# Patient Record
Sex: Female | Born: 1990 | Race: Black or African American | Hispanic: No | Marital: Single | Smoking: Former smoker
Health system: Southern US, Community
[De-identification: ages and names within clinical notes are randomized; demographics above are authoritative.]

## PROBLEM LIST (undated history)

## (undated) HISTORY — PX: TONSILLECTOMY: SUR1361

---
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
## 2013-02-18 ENCOUNTER — Ambulatory Visit (INDEPENDENT_AMBULATORY_CARE_PROVIDER_SITE_OTHER): Payer: Self-pay | Admitting: Family Medicine

## 2013-02-18 VITALS — BP 111/71 | HR 71 | Ht 65.5 in | Wt 144.0 lb

## 2013-02-18 DIAGNOSIS — N3090 Cystitis, unspecified without hematuria: Secondary | ICD-10-CM

## 2013-02-18 DIAGNOSIS — R300 Dysuria: Secondary | ICD-10-CM

## 2013-02-18 LAB — POCT URINALYSIS DIPSTICK
Bilirubin, UA: NEGATIVE
Glucose, UA: NEGATIVE
Ketones, UA: NEGATIVE
Nitrite, UA: NEGATIVE
Spec Grav, UA: 1.015
Urobilinogen, UA: NEGATIVE
pH, UA: 8.5

## 2013-02-18 MED ORDER — METRONIDAZOLE 500 MG PO TABS: 500.0000 mg | ORAL_TABLET | Freq: Three times a day (TID) | ORAL | Status: DC

## 2013-02-18 MED ORDER — RANITIDINE HCL 150 MG PO TABS: 150.0000 mg | ORAL_TABLET | Freq: Two times a day (BID) | ORAL | Status: DC

## 2013-02-18 MED ORDER — LEVONORGESTREL-ETHINYL ESTRAD 0.1-20 MG-MCG PO TABS: 1.0000 | ORAL_TABLET | Freq: Every day | ORAL | Status: DC

## 2013-02-18 MED ORDER — PHENAZOPYRIDINE HCL 200 MG PO TABS: 200.0000 mg | ORAL_TABLET | Freq: Three times a day (TID) | ORAL | Status: DC | PRN

## 2013-02-18 MED ORDER — METRONIDAZOLE 0.75 % VA GEL: 1.0000 | Freq: Two times a day (BID) | VAGINAL | Status: DC

## 2013-02-18 MED ORDER — CIPROFLOXACIN HCL 250 MG PO TABS: 250.0000 mg | ORAL_TABLET | Freq: Two times a day (BID) | ORAL | Status: AC

## 2013-02-18 NOTE — Patient Instructions (Addendum)
1)  Bladder Pain/Dysuria:  Take the Pyridium and the Cipro for a bladder infection (3 - 7 days depending on your symptoms).  We are checking you for gonorrhea and chlamydia with the first morning urine sample.  Remember to urinate before and after sex.  Try taking cranberry pills or drink cranberry juice to help decrease your infections.    2)  Vaginal Discharge: If it continues then you can use the Metrogel twice a day for 5-7 days.    3)  Birth Control:  Start on the birth control pills the first Sunday after your next period after you have gotten a negative pregnancy test.    4)  GERD - Try ranitidine 150 -300 mg daily.    Dysuria Dysuria is the medical term for pain with urination. There are many causes for dysuria, but urinary tract infection is the most common. If a urinalysis was performed it can show that there is a urinary tract infection. A urine culture confirms that you or your child is sick. You will need to follow up with a healthcare provider because:  If a urine culture was done you will need to know the culture results and treatment recommendations.  If the urine culture was positive, you or your child will need to be put on antibiotics or know if the antibiotics prescribed are the right antibiotics for your urinary tract infection.  If the urine culture is negative (no urinary tract infection), then other causes may need to be explored or antibiotics need to be stopped. Today laboratory work may have been done and there does not seem to be an infection. If cultures were done they will take at least 24 to 48 hours to be completed. Today x-rays may have been taken and they read as normal. No cause can be found for the problems. The x-rays may be re-read by a radiologist and you will be contacted if additional findings are made. You or your child may have been put on medications to help with this problem until you can see your primary caregiver. If the problems get better, see your  primary caregiver if the problems return. If you were given antibiotics (medications which kill germs), take all of the mediations as directed for the full course of treatment.  If laboratory work was done, you need to find the results. Leave a telephone number where you can be reached. If this is not possible, make sure you find out how you are to get test results. HOME CARE INSTRUCTIONS   Drink lots of fluids. For adults, drink eight, 8 ounce glasses of clear juice or water a day. For children, replace fluids as suggested by your caregiver.  Empty the bladder often. Avoid holding urine for long periods of time.  After a bowel movement, women should cleanse front to back, using each tissue only once.  Empty your bladder before and after sexual intercourse.  Take all the medicine given to you until it is gone. You may feel better in a few days, but TAKE ALL MEDICINE.  Avoid caffeine, tea, alcohol and carbonated beverages, because they tend to irritate the bladder.  In men, alcohol may irritate the prostate.  Only take over-the-counter or prescription medicines for pain, discomfort, or fever as directed by your caregiver.  If your caregiver has given you a follow-up appointment, it is very important to keep that appointment. Not keeping the appointment could result in a chronic or permanent injury, pain, and disability. If there is any  problem keeping the appointment, you must call back to this facility for assistance. SEEK IMMEDIATE MEDICAL CARE IF:   Back pain develops.  A fever develops.  There is nausea (feeling sick to your stomach) or vomiting (throwing up).  Problems are no better with medications or are getting worse. MAKE SURE YOU:   Understand these instructions.  Will watch your condition.  Will get help right away if you are not doing well or get worse. Document Released: 07/08/2004 Document Revised: 01/02/2012 Document Reviewed: 05/15/2008 [facility] Patient  Information 2013 [place], [place].

## 2013-02-18 NOTE — Progress Notes (Signed)
  Subjective:    Patient ID: Martha Sawyer, female    DOB: 1991/09/28, 22 y.o.   MRN: [id number]  Dysuria  This is a recurrent problem. The current episode started 1 to 4 weeks ago. The problem occurs intermittently. The problem has been gradually worsening. The quality of the pain is described as burning and aching. The pain is at a severity of 5/10. The pain is moderate. There has been no fever. She is sexually active. There is no history of pyelonephritis. Associated symptoms include a discharge, frequency, hematuria, hesitancy and urgency. Pertinent negatives include no chills, flank pain or vomiting. She has tried increased fluids for the symptoms. The treatment provided no relief. Her past medical history is significant for recurrent UTIs.    Review of Systems  Constitutional: Negative for fever and chills.  Gastrointestinal: Positive for abdominal pain. Negative for vomiting.  Genitourinary: Positive for dysuria, hesitancy, urgency, frequency and hematuria. Negative for flank pain.       Objective:   Physical Exam  Constitutional: She appears well-nourished. No distress.  Cardiovascular: Normal rate, regular rhythm and normal heart sounds.   Pulmonary/Chest: Effort normal and breath sounds normal.  Abdominal: She exhibits no mass. There is tenderness. There is no rebound, no guarding and no CVA tenderness.  Genitourinary: Vaginal discharge found.  Neurological: She is alert.  Skin: Skin is warm and dry. No rash noted.  Psychiatric: She has a normal mood and affect.          Assessment & Plan:   1)  Cystitis - Sending off urine culture.    2)  BV   3)   Birth Control   4)  GERD  Meds ordered this encounter  Medications  . levonorgestrel-ethinyl estradiol (AVIANE,ALESSE,LESSINA) 0.1-20 MG-MCG tablet    Sig: Take 1 tablet by mouth daily.    Dispense:  3 Package    Refill:  3  . DISCONTD: metroNIDAZOLE (FLAGYL) 500 MG tablet    Sig: Take 1 tablet (500 mg total) by  mouth 3 (three) times daily.    Dispense:  21 tablet    Refill:  0  . phenazopyridine (PYRIDIUM) 200 MG tablet    Sig: Take 1 tablet (200 mg total) by mouth 3 (three) times daily as needed for pain.    Dispense:  21 tablet    Refill:  0  . ciprofloxacin (CIPRO) 250 MG tablet    Sig: Take 1 tablet (250 mg total) by mouth 2 (two) times daily.    Dispense:  14 tablet    Refill:  0  . metroNIDAZOLE (METROGEL VAGINAL) 0.75 % vaginal gel    Sig: Place 1 Applicatorful vaginally 2 (two) times daily.    Dispense:  70 g    Refill:  1  . ranitidine (ZANTAC) 150 MG tablet    Sig: Take 1 tablet (150 mg total) by mouth 2 (two) times daily.    Dispense:  180 tablet    Refill:  3

## 2013-02-20 ENCOUNTER — Encounter: Payer: Self-pay | Admitting: Family Medicine

## 2013-02-20 DIAGNOSIS — R300 Dysuria: Secondary | ICD-10-CM | POA: Insufficient documentation

## 2013-02-20 DIAGNOSIS — N3090 Cystitis, unspecified without hematuria: Secondary | ICD-10-CM | POA: Insufficient documentation

## 2013-02-23 LAB — URINE CULTURE: Colony Count: >=100000

## 2013-09-07 ENCOUNTER — Emergency Department (HOSPITAL_BASED_OUTPATIENT_CLINIC_OR_DEPARTMENT_OTHER)
Admission: EM | Admit: 2013-09-07 | Discharge: 2013-09-07 | Disposition: A | Discharge status: Home / Self Care | Payer: Self-pay | Attending: Emergency Medicine | Admitting: Emergency Medicine

## 2013-09-07 ENCOUNTER — Encounter (HOSPITAL_BASED_OUTPATIENT_CLINIC_OR_DEPARTMENT_OTHER): Payer: Self-pay | Admitting: Emergency Medicine

## 2013-09-07 DIAGNOSIS — G43909 Migraine, unspecified, not intractable, without status migrainosus: Secondary | ICD-10-CM | POA: Insufficient documentation

## 2013-09-07 MED ORDER — DIPHENHYDRAMINE HCL 50 MG/ML IJ SOLN
25.0000 mg | Freq: Once | INTRAMUSCULAR | Status: AC
  Administered 2013-09-07: 25 mg via INTRAVENOUS
  Filled 2013-09-07: qty 1

## 2013-09-07 MED ORDER — SODIUM CHLORIDE 0.9 % IV BOLUS (SEPSIS)
1000.0000 mL | Freq: Once | INTRAVENOUS | Status: AC
  Administered 2013-09-07: 1000 mL via INTRAVENOUS

## 2013-09-07 MED ORDER — METOCLOPRAMIDE HCL 5 MG/ML IJ SOLN
10.0000 mg | Freq: Once | INTRAMUSCULAR | Status: AC
  Administered 2013-09-07: 10 mg via INTRAVENOUS
  Filled 2013-09-07: qty 2

## 2013-09-07 MED ORDER — DEXAMETHASONE SODIUM PHOSPHATE 10 MG/ML IJ SOLN
10.0000 mg | Freq: Once | INTRAMUSCULAR | Status: AC
  Administered 2013-09-07: 10 mg via INTRAVENOUS
  Filled 2013-09-07: qty 1

## 2013-09-07 NOTE — ED Provider Notes (Signed)
CSN: [id number]     Arrival date & time 09/07/13  1358 History   First MD Initiated Contact with Patient 09/07/13 1508     Chief Complaint  Patient presents with  . Headache   (Consider location/radiation/quality/duration/timing/severity/associated sxs/prior Treatment) Patient is a 22 y.o. female presenting with headaches. The history is provided by the patient. No language interpreter was used.  Headache Pain location:  L temporal Radiates to:  Does not radiate Pain severity now: severe. Pain scale at highest: severe. Onset quality:  Gradual Duration:  4 days Timing:  Constant Progression:  Waxing and waning Chronicity:  Recurrent Similar to prior headaches: yes (though longer lasting)   Relieved by:  Nothing Worsened by:  Light Ineffective treatments:  NSAIDs and acetaminophen Associated symptoms: no abdominal pain, no back pain, no blurred vision, no congestion, no cough, no diarrhea, no dizziness, no fatigue, no fever, no focal weakness, no hearing loss, no loss of balance, no myalgias, no nausea, no near-syncope, no neck pain, no neck stiffness, no numbness, no paresthesias, no photophobia, no sore throat, no syncope, no visual change, no vomiting and no weakness     No past medical history on file. Past Surgical History  Procedure Laterality Date  . Tonsillectomy     No family history on file. History  Substance Use Topics  . Smoking status: Never Smoker   . Smokeless tobacco: Not on file  . Alcohol Use: No   OB History   Grav Para Term Preterm Abortions TAB SAB Ect Mult Living                 Review of Systems  Constitutional: Negative for fever, chills, diaphoresis, activity change, appetite change and fatigue.  HENT: Negative for congestion, facial swelling, hearing loss, rhinorrhea and sore throat.   Eyes: Negative for blurred vision, photophobia and discharge.  Respiratory: Negative for cough, chest tightness and shortness of breath.   Cardiovascular:  Negative for chest pain, palpitations, leg swelling, syncope and near-syncope.  Gastrointestinal: Negative for nausea, vomiting, abdominal pain and diarrhea.  Endocrine: Negative for polydipsia and polyuria.  Genitourinary: Negative for dysuria, frequency, difficulty urinating and pelvic pain.  Musculoskeletal: Negative for arthralgias, back pain, myalgias, neck pain and neck stiffness.  Skin: Negative for color change and wound.  Allergic/Immunologic: Negative for immunocompromised state.  Neurological: Positive for headaches. Negative for dizziness, focal weakness, facial asymmetry, weakness, numbness, paresthesias and loss of balance.  Hematological: Does not bruise/bleed easily.  Psychiatric/Behavioral: Negative for confusion and agitation.    Allergies  Review of patient's allergies indicates no known allergies.  Home Medications  No current outpatient prescriptions on file. BP 114/79  Pulse 77  Temp(Src) 98.8 F (37.1 C) (Oral)  Resp 16  SpO2 100%  LMP 08/26/2013 Physical Exam  Constitutional: She is oriented to person, place, and time. She appears well-developed and well-nourished. No distress.  HENT:  Head: Normocephalic and atraumatic.  Mouth/Throat: No oropharyngeal exudate.  Eyes: Pupils are equal, round, and reactive to light.  Neck: Normal range of motion. Neck supple.  Cardiovascular: Normal rate, regular rhythm and normal heart sounds.  Exam reveals no gallop and no friction rub.   No murmur heard. Pulmonary/Chest: Effort normal and breath sounds normal. No respiratory distress. She has no wheezes. She has no rales.  Abdominal: Soft. Bowel sounds are normal. She exhibits no distension and no mass. There is no tenderness. There is no rebound and no guarding.  Musculoskeletal: Normal range of motion. She exhibits no edema and  no tenderness.  Neurological: She is alert and oriented to person, place, and time. She has normal strength. She displays no atrophy and no  tremor. No cranial nerve deficit or sensory deficit. She exhibits normal muscle tone. She displays a negative Romberg sign. Coordination and gait normal. GCS eye subscore is 4. GCS verbal subscore is 5. GCS motor subscore is 6.  Skin: Skin is warm and dry.  Psychiatric: She has a normal mood and affect.    ED Course  Procedures (including critical care time) Labs Review Labs Reviewed - No data to display Imaging Review No results found.  EKG Interpretation   None       MDM   1. Migraine    Pt is a 22 y.o. female with Pmhx as above who presents with h/a for 4 days, mostly L sided waxing/waning, with associated photophobia.  No fever, chill, neck pain/stiffness, numbness, weakness.  Hx of similar symptoms in the past and was treated with unknown migraine rx.  No PE, neuro exam unremarkable.  Doubt CVA, TIA, intracranial bleed, meningitis.  Symptoms resolved after migraine cocktail.  I believe she is safe for d/c.  Return precautions given for new or worsening symptoms including fever, neck pain/stiffness, numbness, weakness.         Shanna Cisco, MD 09/07/13 [phone]

## 2013-09-07 NOTE — ED Notes (Signed)
Pt having headache x 4 days.  No fever, no N/V.  Some pain when looking at a bright light but not sound sensitive.

## 2014-01-30 ENCOUNTER — Emergency Department (HOSPITAL_BASED_OUTPATIENT_CLINIC_OR_DEPARTMENT_OTHER)
Admission: EM | Admit: 2014-01-30 | Discharge: 2014-01-30 | Disposition: A | Discharge status: Home / Self Care | Payer: BC Managed Care – PPO | Attending: Emergency Medicine | Admitting: Emergency Medicine

## 2014-01-30 ENCOUNTER — Encounter (HOSPITAL_BASED_OUTPATIENT_CLINIC_OR_DEPARTMENT_OTHER): Payer: Self-pay | Admitting: Emergency Medicine

## 2014-01-30 DIAGNOSIS — A599 Trichomoniasis, unspecified: Secondary | ICD-10-CM

## 2014-01-30 DIAGNOSIS — Z3202 Encounter for pregnancy test, result negative: Secondary | ICD-10-CM | POA: Insufficient documentation

## 2014-01-30 DIAGNOSIS — A5901 Trichomonal vulvovaginitis: Secondary | ICD-10-CM | POA: Insufficient documentation

## 2014-01-30 LAB — URINALYSIS, ROUTINE W REFLEX MICROSCOPIC
Bilirubin Urine: NEGATIVE
Glucose, UA: NEGATIVE mg/dL
Ketones, ur: NEGATIVE mg/dL
Nitrite: NEGATIVE
PH: 6 (ref 5.0–8.0)
Protein, ur: NEGATIVE mg/dL
Specific Gravity, Urine: 1.022 (ref 1.005–1.030)
Urobilinogen, UA: 1 mg/dL (ref 0.0–1.0)

## 2014-01-30 LAB — URINE MICROSCOPIC-ADD ON

## 2014-01-30 LAB — PREGNANCY, URINE: PREG TEST UR: NEGATIVE

## 2014-01-30 LAB — WET PREP, GENITAL: Yeast Wet Prep HPF POC: NONE SEEN

## 2014-01-30 MED ORDER — METRONIDAZOLE 500 MG PO TABS: 500.0000 mg | ORAL_TABLET | Freq: Two times a day (BID) | ORAL | Status: DC

## 2014-01-30 NOTE — ED Provider Notes (Signed)
Medical screening examination/treatment/procedure(s) were performed by non-physician practitioner and as supervising physician I was immediately available for consultation/collaboration.   EKG Interpretation None        William Kristi Hyer, MD 01/30/14 2303 

## 2014-01-30 NOTE — ED Notes (Signed)
Brown vaginal discharge for a few weeks. Lower abdominal pain.

## 2014-01-30 NOTE — ED Provider Notes (Signed)
CSN: [id number]     Arrival date & time 01/30/14  1343 History   First MD Initiated Contact with Patient 01/30/14 1528     Chief Complaint  Patient presents with  . Abdominal Pain     (Consider location/radiation/quality/duration/timing/severity/associated sxs/prior Treatment) Patient is a 23 y.o. female presenting with abdominal pain. The history is provided by the patient. No language interpreter was used.  Abdominal Pain Pain location:  RLQ and LLQ Pain quality: aching   Pain radiates to:  Does not radiate Pain severity:  Mild Onset quality:  Gradual Progression:  Worsening Chronicity:  New Relieved by:  Nothing Worsened by:  Nothing tried Ineffective treatments:  None tried Associated symptoms: vaginal discharge     History reviewed. No pertinent past medical history. Past Surgical History  Procedure Laterality Date  . Tonsillectomy     No family history on file. History  Substance Use Topics  . Smoking status: Never Smoker   . Smokeless tobacco: Not on file  . Alcohol Use: No   OB History   Grav Para Term Preterm Abortions TAB SAB Ect Mult Living                 Review of Systems  Gastrointestinal: Positive for abdominal pain.  Genitourinary: Positive for vaginal discharge and vaginal pain.  All other systems reviewed and are negative.     Allergies  Review of patient's allergies indicates no known allergies.  Home Medications  No current outpatient prescriptions on file. BP 103/68  Pulse 86  Temp(Src) 98.4 F (36.9 C) (Oral)  Resp 20  Ht 5\' 6"  (1.676 m)  Wt 144 lb (65.318 kg)  BMI 23.25 kg/m2  SpO2 100%  LMP 01/16/2014 Physical Exam  Nursing note and vitals reviewed. Constitutional: She is oriented to person, place, and time. She appears well-developed and well-nourished.  HENT:  Head: Normocephalic and atraumatic.  Eyes: Conjunctivae and EOM are normal. Pupils are equal, round, and reactive to light.  Neck: Normal range of motion.   Cardiovascular: Normal rate.   Pulmonary/Chest: Effort normal.  Abdominal: Soft. She exhibits no distension.  Genitourinary: Vaginal discharge found.  scant white discharge  Musculoskeletal: Normal range of motion.  Neurological: She is alert and oriented to person, place, and time.  Skin: Skin is warm.  Psychiatric: She has a normal mood and affect.    ED Course  Procedures (including critical care time) Labs Review Labs Reviewed  URINALYSIS, ROUTINE W REFLEX MICROSCOPIC - Abnormal; Notable for the following:    APPearance CLOUDY (*)    Hgb urine dipstick MODERATE (*)    Leukocytes, UA MODERATE (*)    All other components within normal limits  URINE MICROSCOPIC-ADD ON - Abnormal; Notable for the following:    Squamous Epithelial / LPF FEW (*)    Bacteria, UA MANY (*)    All other components within normal limits  WET PREP, GENITAL  GC/CHLAMYDIA PROBE AMP  PREGNANCY, URINE   Imaging Review No results found.   EKG Interpretation None      MDM   Final diagnoses:  Trichimoniasis   Flagyl RX.     Lonia SkinnerLeslie K WordenSofia, PA-C 01/30/14 1606

## 2014-01-30 NOTE — Discharge Instructions (Signed)
Trichomoniasis °Trichomoniasis is an infection, caused by the Trichomonas organism, that affects both women and men. In women, the outer female genitalia and the vagina are affected. In men, the penis is mainly affected, but the prostate and other reproductive organs can also be involved. Trichomoniasis is a sexually transmitted disease (STD) and is most often passed to another person through sexual contact. The majority of people who get trichomoniasis do so from a sexual encounter and are also at risk for other STDs. °CAUSES  °· Sexual intercourse with an infected partner. °· It can be present in swimming pools or hot tubs. °SYMPTOMS  °· Abnormal gray-green frothy vaginal discharge in women. °· Vaginal itching and irritation in women. °· Itching and irritation of the area outside the vagina in women. °· Penile discharge with or without pain in males. °· Inflammation of the urethra (urethritis), causing painful urination. °· Bleeding after sexual intercourse. °RELATED COMPLICATIONS °· Pelvic inflammatory disease. °· Infection of the uterus (endometritis). °· Infertility. °· Tubal (ectopic) pregnancy. °· It can be associated with other STDs, including gonorrhea and chlamydia, hepatitis B, and HIV. °COMPLICATIONS DURING PREGNANCY °· Early (premature) delivery. °· Premature rupture of the membranes (PROM). °· Low birth weight. °DIAGNOSIS  °· Visualization of Trichomonas under the microscope from the vagina discharge. °· Ph of the vagina greater than 4.5, tested with a test tape. °· Trich Rapid Test. °· Culture of the organism, but this is not usually needed. °· It may be found on a Pap test. °· Having a "strawberry cervix,"which means the cervix looks very red like a strawberry. °TREATMENT  °· You may be given medication to fight the infection. Inform your caregiver if you could be or are pregnant. Some medications used to treat the infection should not be taken during pregnancy. °· Over-the-counter medications or  creams to decrease itching or irritation may be recommended. °· Your sexual partner will need to be treated if infected. °HOME CARE INSTRUCTIONS  °· Take all medication prescribed by your caregiver. °· Take over-the-counter medication for itching or irritation as directed by your caregiver. °· Do not have sexual intercourse while you have the infection. °· Do not douche or wear tampons. °· Discuss your infection with your partner, as your partner may have acquired the infection from you. Or, your partner may have been the person who transmitted the infection to you. °· Have your sex partner examined and treated if necessary. °· Practice safe, informed, and protected sex. °· See your caregiver for other STD testing. °SEEK MEDICAL CARE IF:  °· You still have symptoms after you finish the medication. °· You have an oral temperature above 102° F (38.9° C). °· You develop belly (abdominal) pain. °· You have pain when you urinate. °· You have bleeding after sexual intercourse. °· You develop a rash. °· The medication makes you sick or makes you throw up (vomit). °Document Released: 04/05/2001 Document Revised: 01/02/2012 Document Reviewed: 05/01/2009 °[employer]® Patient Information ©2014 [employer]. ° °

## 2014-01-30 NOTE — ED Notes (Signed)
Pelvic cart at bedside. 

## 2014-01-31 LAB — GC/CHLAMYDIA PROBE AMP
CT PROBE, AMP APTIMA: NEGATIVE
GC PROBE AMP APTIMA: NEGATIVE

## 2014-02-11 ENCOUNTER — Ambulatory Visit: Payer: Self-pay | Admitting: Family Medicine

## 2014-04-01 ENCOUNTER — Encounter: Payer: Self-pay | Admitting: Family Medicine

## 2014-04-01 ENCOUNTER — Ambulatory Visit (INDEPENDENT_AMBULATORY_CARE_PROVIDER_SITE_OTHER): Payer: BC Managed Care – PPO | Admitting: Family Medicine

## 2014-04-01 ENCOUNTER — Other Ambulatory Visit (HOSPITAL_COMMUNITY)
Admission: RE | Admit: 2014-04-01 | Discharge: 2014-04-01 | Disposition: A | Discharge status: Home / Self Care | Payer: BC Managed Care – PPO | Source: Ambulatory Visit | Attending: Family Medicine | Admitting: Family Medicine

## 2014-04-01 VITALS — BP 117/76 | HR 86 | Resp 16 | Ht 65.5 in | Wt 145.0 lb

## 2014-04-01 DIAGNOSIS — Z3202 Encounter for pregnancy test, result negative: Secondary | ICD-10-CM

## 2014-04-01 DIAGNOSIS — Z01419 Encounter for gynecological examination (general) (routine) without abnormal findings: Secondary | ICD-10-CM | POA: Insufficient documentation

## 2014-04-01 DIAGNOSIS — B373 Candidiasis of vulva and vagina: Secondary | ICD-10-CM

## 2014-04-01 DIAGNOSIS — Z124 Encounter for screening for malignant neoplasm of cervix: Secondary | ICD-10-CM

## 2014-04-01 DIAGNOSIS — H6120 Impacted cerumen, unspecified ear: Secondary | ICD-10-CM

## 2014-04-01 DIAGNOSIS — B3731 Acute candidiasis of vulva and vagina: Secondary | ICD-10-CM

## 2014-04-01 DIAGNOSIS — B9689 Other specified bacterial agents as the cause of diseases classified elsewhere: Secondary | ICD-10-CM

## 2014-04-01 DIAGNOSIS — N760 Acute vaginitis: Secondary | ICD-10-CM | POA: Insufficient documentation

## 2014-04-01 DIAGNOSIS — A499 Bacterial infection, unspecified: Secondary | ICD-10-CM

## 2014-04-01 DIAGNOSIS — Z113 Encounter for screening for infections with a predominantly sexual mode of transmission: Secondary | ICD-10-CM | POA: Insufficient documentation

## 2014-04-01 LAB — POCT URINALYSIS DIPSTICK
Bilirubin, UA: NEGATIVE
Glucose, UA: NEGATIVE
Ketones, UA: NEGATIVE
Leukocytes, UA: NEGATIVE
Nitrite, UA: NEGATIVE
Protein, UA: NEGATIVE
Spec Grav, UA: 1.015
Urobilinogen, UA: NEGATIVE
pH, UA: 6.5

## 2014-04-01 LAB — POCT URINE PREGNANCY: Preg Test, Ur: NEGATIVE

## 2014-04-01 MED ORDER — METRONIDAZOLE 500 MG PO TABS: 500.0000 mg | ORAL_TABLET | Freq: Two times a day (BID) | ORAL | Status: AC

## 2014-04-01 MED ORDER — TERCONAZOLE 0.8 % VA CREA: 1.0000 | TOPICAL_CREAM | Freq: Every day | VAGINAL | Status: AC

## 2014-04-01 NOTE — Patient Instructions (Addendum)
1)  Birth Control - Try the Nuvaring to see if you like it.  You will insert it using a cardboard tampon applicator into the vagina the Sunday after you start your next period.  You will wear it for 3 weeks tthen take out and don't wear one for 1 week then put it in the following Sunday.  If you want to get the 3 year birth control for the arm contact [facility] at [facility].     Etonogestrel implant What is this medicine? ETONOGESTREL (et oh noe JES trel) is a contraceptive (birth control) device. It is used to prevent pregnancy. It can be used for up to 3 years. This medicine may be used for other purposes; ask your health care provider or pharmacist if you have questions. COMMON BRAND NAME(S): Implanon, Nexplanon  What should I tell my health care provider before I take this medicine? They need to know if you have any of these conditions: -abnormal vaginal bleeding -blood vessel disease or blood clots -cancer of the breast, cervix, or liver -depression -diabetes -gallbladder disease -headaches -heart disease or recent heart attack -high blood pressure -high cholesterol -kidney disease -liver disease -renal disease -seizures -tobacco smoker -an unusual or allergic reaction to etonogestrel, other hormones, anesthetics or antiseptics, medicines, foods, dyes, or preservatives -pregnant or trying to get pregnant -breast-feeding How should I use this medicine? This device is inserted just under the skin on the inner side of your upper arm by a health care professional. Talk to your pediatrician regarding the use of this medicine in children. Special care may be needed. Overdosage: If you think you've taken too much of this medicine contact a poison control center or emergency room at once. Overdosage: If you think you have taken too much of this medicine contact a poison control center or emergency room at once. NOTE: This medicine is only for you. Do not share this medicine with  others. What if I miss a dose? This does not apply. What may interact with this medicine? Do not take this medicine with any of the following medications: -amprenavir -bosentan -fosamprenavir This medicine may also interact with the following medications: -barbiturate medicines for inducing sleep or treating seizures -certain medicines for fungal infections like ketoconazole and itraconazole -griseofulvin -medicines to treat seizures like carbamazepine, felbamate, oxcarbazepine, phenytoin, topiramate -modafinil -phenylbutazone -rifampin -some medicines to treat HIV infection like atazanavir, indinavir, lopinavir, nelfinavir, tipranavir, ritonavir -St. John's wort This list may not describe all possible interactions. Give your health care provider a list of all the medicines, herbs, non-prescription drugs, or dietary supplements you use. Also tell them if you smoke, drink alcohol, or use illegal drugs. Some items may interact with your medicine. What should I watch for while using this medicine? This product does not protect you against HIV infection (AIDS) or other sexually transmitted diseases. You should be able to feel the implant by pressing your fingertips over the skin where it was inserted. Tell your doctor if you cannot feel the implant. What side effects may I notice from receiving this medicine? Side effects that you should report to your doctor or health care professional as soon as possible: -allergic reactions like skin rash, itching or hives, swelling of the face, lips, or tongue -breast lumps -changes in vision -confusion, trouble speaking or understanding -dark urine -depressed mood -general ill feeling or flu-like symptoms -light-colored stools -loss of appetite, nausea -right upper belly pain -severe headaches -severe pain, swelling, or tenderness in the abdomen -shortness  of breath, chest pain, swelling in a leg -signs of pregnancy -sudden numbness or weakness  of the face, arm or leg -trouble walking, dizziness, loss of balance or coordination -unusual vaginal bleeding, discharge -unusually weak or tired -yellowing of the eyes or skin Side effects that usually do not require medical attention (Report these to your doctor or health care professional if they continue or are bothersome.): -acne -breast pain -changes in weight -cough -fever or chills -headache -irregular menstrual bleeding -itching, burning, and vaginal discharge -pain or difficulty passing urine -sore throat This list may not describe all possible side effects. Call your doctor for medical advice about side effects. You may report side effects to FDA at 1-800-FDA-1088. Where should I keep my medicine? This drug is given in a hospital or clinic and will not be stored at home. NOTE: This sheet is a summary. It may not cover all possible information. If you have questions about this medicine, talk to your doctor, pharmacist, or health care provider.  2014, [employer]vier/[employer] Standard. (2012-04-16 15:37:45)

## 2014-04-01 NOTE — Progress Notes (Signed)
Subjective:    Patient ID: Martha Sawyer, female    DOB: 02/19/1991, 23 y.o.   MRN: [id number]  HPI  Martha Sawyer is here today for a GYN exam.  Overall, she feels that she is doing well except for a couple of issues:    1)  Left Ear:  Her left ear feels stopped up. She would like to have it flushed out.  2)  Vaginal Discomfort:  She says that she is having some vaginal discomfort. She denies vaginal discharge, odor or dysuria. She feels that she may have BV. She does have a little itching.    Review of Systems  Constitutional: Negative for activity change, appetite change, fatigue and unexpected weight change.  HENT: Negative for congestion, dental problem, ear pain, hearing loss, trouble swallowing and voice change.        Left ear feels stopped up  Eyes: Negative for pain, redness and visual disturbance.  Respiratory: Negative for cough and shortness of breath.   Cardiovascular: Negative for chest pain, palpitations and leg swelling.  Gastrointestinal: Negative for nausea, vomiting, abdominal pain, diarrhea, constipation and blood in stool.  Endocrine: Negative for cold intolerance, heat intolerance, polydipsia, polyphagia and polyuria.  Genitourinary: Positive for vaginal pain. Negative for dysuria, urgency, frequency, hematuria, vaginal discharge, menstrual problem and pelvic pain.  Musculoskeletal: Negative for arthralgias, back pain, joint swelling, myalgias and neck pain.  Skin: Negative for rash.  Neurological: Negative for dizziness, weakness and headaches.  Hematological: Negative for adenopathy. Does not bruise/bleed easily.  Psychiatric/Behavioral: Negative for sleep disturbance, dysphoric mood and decreased concentration. The patient is not nervous/anxious.   All other systems reviewed and are negative.    Past Surgical History  Procedure Laterality Date  . 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 Lupus Mother   . Diabetes Maternal Aunt   . Diabetes Maternal Uncle   . Hypertension Maternal Grandmother   . Depression Maternal Grandmother     No Known Allergies      Objective:   Physical Exam  Nursing note and vitals reviewed. Constitutional: She is oriented to person, place, and time. She appears well-developed and well-nourished.  HENT:  Head: Normocephalic and atraumatic.  Right Ear: External ear normal.  Left Ear: There is drainage.  Nose: Nose normal.  Mouth/Throat: Oropharynx is clear and moist.  Eyes: Conjunctivae are normal. Pupils are equal, round, and reactive to light. No scleral icterus.  Neck: Normal range of motion. Neck supple. No thyromegaly present.  Cardiovascular: Normal rate, regular rhythm, normal heart sounds and intact distal pulses.  Exam reveals no gallop and no friction rub.   No murmur heard. Pulmonary/Chest: Effort normal and breath sounds normal.  Abdominal: Soft. Bowel sounds are normal.  Genitourinary: Uterus normal. Vaginal discharge found.  Musculoskeletal: Normal range of motion. She exhibits no edema and no tenderness.  Lymphadenopathy:    She has no cervical adenopathy.  Neurological: She is alert and oriented to person, place, and time. She has normal reflexes.  Skin: Skin is warm and dry.  Psychiatric: She has a normal mood and affect. Her behavior is normal. Judgment and thought content normal.      Assessment &  Plan:    Martha Sawyer was seen today for gynecologic exam.  Diagnoses and associated orders for this visit:  Routine gynecological examination - POCT urinalysis dipstick  Screening for malignant neoplasm of the cervix - Cytology - PAP  BV (bacterial vaginosis) - metroNIDAZOLE (FLAGYL) 500 MG tablet; Take 1 tablet (500 mg total) by mouth 2  (two) times daily with a meal. DO NOT CONSUME ALCOHOL WHILE TAKING THIS MEDICATION.  Yeast vaginitis - terconazole (TERAZOL 3) 0.8 % vaginal cream; Place 1 applicator vaginally at bedtime.  Negative pregnancy test - POCT urine pregnancy

## 2014-04-03 ENCOUNTER — Other Ambulatory Visit (HOSPITAL_COMMUNITY)
Admission: RE | Admit: 2014-04-03 | Discharge: 2014-04-03 | Disposition: A | Discharge status: Home / Self Care | Payer: BC Managed Care – PPO | Source: Ambulatory Visit | Attending: Family Medicine | Admitting: Family Medicine

## 2014-04-03 LAB — CYTOLOGY - PAP

## 2014-06-09 DIAGNOSIS — B373 Candidiasis of vulva and vagina: Secondary | ICD-10-CM | POA: Insufficient documentation

## 2014-06-09 DIAGNOSIS — N760 Acute vaginitis: Secondary | ICD-10-CM | POA: Insufficient documentation

## 2014-06-09 DIAGNOSIS — B9689 Other specified bacterial agents as the cause of diseases classified elsewhere: Secondary | ICD-10-CM | POA: Insufficient documentation

## 2014-06-09 DIAGNOSIS — B3731 Acute candidiasis of vulva and vagina: Secondary | ICD-10-CM | POA: Insufficient documentation

## 2016-08-23 ENCOUNTER — Encounter (HOSPITAL_BASED_OUTPATIENT_CLINIC_OR_DEPARTMENT_OTHER): Payer: Self-pay | Admitting: Emergency Medicine

## 2016-08-23 ENCOUNTER — Emergency Department (HOSPITAL_BASED_OUTPATIENT_CLINIC_OR_DEPARTMENT_OTHER)
Admission: EM | Admit: 2016-08-23 | Discharge: 2016-08-23 | Disposition: A | Discharge status: Home / Self Care | Payer: Self-pay | Attending: Physician Assistant | Admitting: Physician Assistant

## 2016-08-23 DIAGNOSIS — N760 Acute vaginitis: Secondary | ICD-10-CM | POA: Insufficient documentation

## 2016-08-23 DIAGNOSIS — B9689 Other specified bacterial agents as the cause of diseases classified elsewhere: Secondary | ICD-10-CM

## 2016-08-23 LAB — WET PREP, GENITAL
SPERM: NONE SEEN
Trich, Wet Prep: NONE SEEN
Yeast Wet Prep HPF POC: NONE SEEN

## 2016-08-23 LAB — URINE MICROSCOPIC-ADD ON

## 2016-08-23 LAB — URINALYSIS, ROUTINE W REFLEX MICROSCOPIC
BILIRUBIN URINE: NEGATIVE
Glucose, UA: NEGATIVE mg/dL
KETONES UR: NEGATIVE mg/dL
NITRITE: NEGATIVE
PROTEIN: NEGATIVE mg/dL
Specific Gravity, Urine: 1.013 (ref 1.005–1.030)
pH: 7 (ref 5.0–8.0)

## 2016-08-23 LAB — PREGNANCY, URINE: PREG TEST UR: NEGATIVE

## 2016-08-23 MED ORDER — METRONIDAZOLE 500 MG PO TABS: 500.0000 mg | ORAL_TABLET | Freq: Two times a day (BID) | ORAL | 0 refills | Status: AC

## 2016-08-23 MED ORDER — METRONIDAZOLE 500 MG PO TABS
500.0000 mg | ORAL_TABLET | Freq: Once | ORAL | Status: AC
  Administered 2016-08-23: 500 mg via ORAL
  Filled 2016-08-23: qty 1

## 2016-08-23 NOTE — ED Provider Notes (Signed)
[facility] Note   CSN: [id number] Arrival date & time: 08/23/16  [id number]  By signing my name below, I, Martha Sawyer, attest that this documentation has been prepared under the direction and in the presence of Sandar Krinke Randall AnLyn Jolyn Deshmukh, MD. Electronically Signed: Soijett Sawyer, ED Scribe. 08/23/16. 8:30 PM.   History   Chief Complaint Chief Complaint  Patient presents with  . Pelvic Pain    HPI Martha Sawyer is a 25 y.o. female who presents to the Emergency Department complaining of lower abdominal pain onset 2 weeks. Patient notes that her ast menstrual period was 07/24/2016. Pt denies any new sexual partners at this time. Pt is having associated symptoms of white vaginal discharge. She notes that she has not tried any medications for the relief of her symptoms. She denies diarrhea, constipation, and any other symptoms. Pt PCP is Dr. Birdena Jubileeobyn Zanard with no recent appointments until 08/29/16.  Pt secondarily complains of a test result of HCV that resulted positive. Pt notes that the clinic was going to re-run the labs to confirm the HCV result. Pt denies IV drug use or having a blood transfusion. Pt hasn't tried any medications for the relief of her symptoms. Pt denies any other symptoms. 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   The history is provided by the patient. No language interpreter was used.    History reviewed. No pertinent past medical history.  Patient Active Problem List   Diagnosis Date Noted  . BV (bacterial vaginosis) 06/09/2014  . Yeast vaginitis 06/09/2014  . Dysuria 02/20/2013  . Cystitis 02/20/2013    Past Surgical History:  Procedure Laterality Date  . TONSILLECTOMY      OB History    No data available       Home Medications    Prior to Admission medications   Not on File    Family History Family History  Problem Relation Age of Onset  . Lupus Mother   . Hypertension Maternal Grandmother   . Depression  Maternal Grandmother   . Diabetes Maternal Aunt   . Diabetes Maternal Uncle     Social History Social History  Substance Use Topics  . Smoking status: Never Smoker  . Smokeless tobacco: Never Used  . Alcohol use No     Allergies   Review of patient's allergies indicates no known allergies.   Review of Systems Review of Systems  All other systems reviewed and are negative.    Physical Exam Updated Vital Signs BP 125/72 (BP Location: Right Arm)   Pulse 83   Temp 98.4 F (36.9 C) (Oral)   Resp 18   Ht 5\' 6"  (1.676 m)   Wt 140 lb (63.5 kg)   LMP 07/24/2016   SpO2 100%   BMI 22.60 kg/m   Physical Exam  Constitutional: She is oriented to person, place, and time. She appears well-developed and well-nourished. No distress.  HENT:  Head: Normocephalic and atraumatic.  Eyes: EOM are normal.  Neck: Neck supple.  Cardiovascular: Normal rate.   Pulmonary/Chest: Effort normal. No respiratory distress.  Abdominal: Soft. She exhibits no distension. There is no tenderness.  Genitourinary: Cervix exhibits no motion tenderness. Vaginal discharge found.  Genitourinary Comments: Chaperone present for exam. Thin white discharge. No CMT. No pain on palpation.  Musculoskeletal: Normal range of motion.  Neurological: She is alert and oriented to person, place, and time.  Skin: Skin is warm and dry.  Psychiatric: She has a normal mood  and affect. Her behavior is normal.  Nursing note and vitals reviewed.    ED Treatments / Results  DIAGNOSTIC STUDIES: Oxygen Saturation is 100% on RA, nl by my interpretation.    COORDINATION OF CARE: 8:06 PM Discussed treatment plan with pt at bedside which includes pelvic exam, wet prep, UA, and pt agreed to plan.   Labs (all labs ordered are listed, but only abnormal results are displayed) Labs Reviewed  WET PREP, GENITAL - Abnormal; Notable for the following:       Result Value   Clue Cells Wet Prep HPF POC PRESENT (*)    WBC, Wet Prep  HPF POC FEW (*)    All other components within normal limits  URINALYSIS, ROUTINE W REFLEX MICROSCOPIC (NOT AT [facility]) - Abnormal; Notable for the following:    Hgb urine dipstick SMALL (*)    Leukocytes, UA SMALL (*)    All other components within normal limits  URINE MICROSCOPIC-ADD ON - Abnormal; Notable for the following:    Squamous Epithelial / LPF 0-5 (*)    Bacteria, UA FEW (*)    All other components within normal limits  PREGNANCY, URINE  GC/CHLAMYDIA PROBE AMP ([place]) NOT AT [facility]    Procedures Procedures (including critical care time)  Medications Ordered in ED Medications  metroNIDAZOLE (FLAGYL) tablet 500 mg (not administered)     Initial Impression / Assessment and Plan / ED Course  I have reviewed the triage vital signs and the nursing notes.  Pertinent labs that were available during my care of the patient were reviewed by me and considered in my medical decision making (see chart for details).  Clinical Course   Patient is a 25 year old female presenting for 2 things. 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 They are going to repeat it. No IVDU, no ho transfusion. She was curious whether we could repeat it here. I told her that we do not do that very readily but she should follow-up with her primary care patient in the in the morning for this. She agrees that this will be a good process. Secondly she has occasional suprapubic/pelvic pain. This is one of the last 2 weeks. She has mild discharge. Vaginal exam shows thin discharge. Positive clue cells. We'll treat for BV. Do not suspect any kind of ovarian torsion or other pathology given her low-grade pain and time period since it started.  Final Clinical Impressions(s) / ED Diagnoses   Final diagnoses:  None    New Prescriptions New Prescriptions   No medications on file   I personally performed the services described in this documentation, which was  scribed in my presence. The recorded information has been reviewed and is accurate.      Mazzie Brodrick Randall AnLyn Denyce Harr, MD 08/23/16 2126

## 2016-08-23 NOTE — ED Triage Notes (Signed)
Patient reports that she has had pain to her pelvic region and discharge for about 2 weeks. Denies any N/V/D

## 2016-08-23 NOTE — ED Notes (Signed)
ED Provider at bedside. 

## 2016-08-23 NOTE — Discharge Instructions (Signed)
We are treating you for BV. Please return with any concerns.

## 2016-08-24 LAB — GC/CHLAMYDIA PROBE AMP (~~LOC~~) NOT AT ARMC
Chlamydia: NEGATIVE
Neisseria Gonorrhea: NEGATIVE

## 2017-02-13 ENCOUNTER — Encounter (HOSPITAL_BASED_OUTPATIENT_CLINIC_OR_DEPARTMENT_OTHER): Payer: Self-pay | Admitting: *Deleted

## 2017-02-13 ENCOUNTER — Emergency Department (HOSPITAL_BASED_OUTPATIENT_CLINIC_OR_DEPARTMENT_OTHER)
Admission: EM | Admit: 2017-02-13 | Discharge: 2017-02-13 | Disposition: A | Discharge status: Home / Self Care | Payer: Self-pay | Attending: Emergency Medicine | Admitting: Emergency Medicine

## 2017-02-13 DIAGNOSIS — R21 Rash and other nonspecific skin eruption: Secondary | ICD-10-CM | POA: Insufficient documentation

## 2017-02-13 MED ORDER — PREDNISONE 20 MG PO TABS: ORAL_TABLET | ORAL | 0 refills | Status: AC

## 2017-02-13 MED FILL — predniSONE 20 MG TABS: 20 | 7 days supply | Qty: 14 | Fill #0

## 2017-02-13 NOTE — ED Provider Notes (Signed)
[facility]   CSN: [id number] Arrival date & time: 02/13/17  1156     History   Chief Complaint Chief Complaint  Patient presents with  . Rash    HPI Martha Sawyer is a 26 y.o. female.Patient developed a rash on her thighs bilaterally and on her arms bilaterally and slight swelling for face which started yesterday, rashes nonpainful and mildly pruritic. She does not feel ill. Denies fever No new medications or foods no difficulty breathing no difficulty swallowing no voice change. No other associated symptoms treated self with Benadryl without relief. Nothing makes symptoms better or worse however she feels rashes slightly worse today than yesterday  HPI  History reviewed. No pertinent past medical history.  Patient Active Problem List   Diagnosis Date Noted  . BV (bacterial vaginosis) 06/09/2014  . Yeast vaginitis 06/09/2014  . Dysuria 02/20/2013  . Cystitis 02/20/2013    Past Surgical History:  Procedure Laterality Date  . TONSILLECTOMY      OB History    No data available       Home Medications    Prior to Admission medications   Medication Sig Start Date End Date Taking? Authorizing Provider  metroNIDAZOLE (FLAGYL) 500 MG tablet Take 1 tablet (500 mg total) by mouth 2 (two) times daily. 08/23/16   Courteney Lyn Mackuen, MD  predniSONE (DELTASONE) 20 MG tablet 2 tabs po daily x 7 days 02/13/17   Doug Sou, MD    Family History Family History  Problem Relation Age of Onset  . Lupus Mother   . Hypertension Maternal Grandmother   . Depression Maternal Grandmother   . Diabetes Maternal Aunt   . Diabetes Maternal Uncle     Social History Social History  Substance Use Topics  . Smoking status: Never Smoker  . Smokeless tobacco: Never Used  . Alcohol use No     Allergies   Patient has no known allergies.   Review of Systems Review of Systems  Constitutional: Negative.   HENT: Negative.   Respiratory: Negative.     Cardiovascular: Negative.   Gastrointestinal: Negative.   Musculoskeletal: Negative.   Skin: Positive for rash.  Neurological: Negative.   Psychiatric/Behavioral: Negative.   All other systems reviewed and are negative.    Physical Exam Updated Vital Signs BP 114/75 (BP Location: Left Arm)   Pulse 82   Temp 98.9 F (37.2 C) (Oral)   Resp 16   Ht  (1.676 m)   Wt 139 lb (63 kg)   LMP 02/06/2017   SpO2 100%   BMI 22.44 kg/m   Physical Exam  Constitutional: She is oriented to person, place, and time. She appears well-developed and well-nourished. No distress.  HENT:  Head: Normocephalic and atraumatic.  No mucosal lesion  Eyes: Conjunctivae are normal. Pupils are equal, round, and reactive to light.  Neck: Neck supple. No tracheal deviation present. No thyromegaly present.  Cardiovascular: Normal rate and regular rhythm.   No murmur heard. Pulmonary/Chest: Effort normal and breath sounds normal.  Abdominal: Soft. Bowel sounds are normal. She exhibits no distension. There is no tenderness.  Musculoskeletal: Normal range of motion. She exhibits no edema or tenderness.  Neurological: She is alert and oriented to person, place, and time. Coordination normal.  Skin: Skin is warm and dry. Rash noted.  Pinkish confluent rash on bilateral thighs, medial aspect most prominent. Also on bilateral upper arms. Rashes not involve palms or soles. Is nonpalpable.  Psychiatric: She has a normal  mood and affect.  Nursing note and vitals reviewed.    ED Treatments / Results  Labs (all labs ordered are listed, but only abnormal results are displayed) Labs Reviewed - No data to display  EKG  EKG Interpretation None       Radiology No results found.  Procedures Procedures (including critical care time)  Medications Ordered in ED Medications - No data to display   Initial Impression / Assessment and Plan / ED Course  I have reviewed the triage vital signs and the  nursing notes.  Pertinent labs & imaging results that were available during my care of the patient were reviewed by me and considered in my medical decision making (see chart for details).     Rest at this point is felt to be nonspecific. She is to take Benadryl as needed for itch Plan prescription prednisone, continue to take Benadryl as needed for itch. Referral Dr.Lupton, dermatologist as needed Final Clinical Impressions(s) / ED Diagnoses  Diagnosis nonspecific rash Final diagnoses:  Rash    New Prescriptions Discharge Medication List as of 02/13/2017 12:43 PM    START taking these medications   Details  predniSONE (DELTASONE) 20 MG tablet 2 tabs po daily x 7 days, Print         Doug Sou, MD 02/13/17 1300

## 2017-02-13 NOTE — ED Triage Notes (Signed)
Woke with rash yesterday. She has been taking Benadryl. Last dose 11pm. None today.

## 2017-02-13 NOTE — Discharge Instructions (Signed)
Take the medication as prescribed. You can continue to take Benadryl 50 mg every 6 hours if needed for itch. Benadryl can cause drowsiness so don't drive or operate machinery while taking Benadryl. Call Dr. Terri Piedra or call Dr. Alberteen Reece Fehnel for referral to a dermatologist if not improving in 3 or 4 days. Return if you develop difficulty breathing, difficulty swallowing or feel worse for any reason

## 2018-07-28 ENCOUNTER — Encounter (HOSPITAL_BASED_OUTPATIENT_CLINIC_OR_DEPARTMENT_OTHER): Payer: Self-pay | Admitting: *Deleted

## 2018-07-28 ENCOUNTER — Other Ambulatory Visit: Payer: Self-pay

## 2018-07-28 ENCOUNTER — Emergency Department (HOSPITAL_BASED_OUTPATIENT_CLINIC_OR_DEPARTMENT_OTHER): Payer: Managed Care, Other (non HMO)

## 2018-07-28 ENCOUNTER — Emergency Department (HOSPITAL_BASED_OUTPATIENT_CLINIC_OR_DEPARTMENT_OTHER)
Admission: EM | Admit: 2018-07-28 | Discharge: 2018-07-28 | Disposition: A | Discharge status: Home / Self Care | Payer: Managed Care, Other (non HMO) | Attending: Emergency Medicine | Admitting: Emergency Medicine

## 2018-07-28 DIAGNOSIS — R112 Nausea with vomiting, unspecified: Secondary | ICD-10-CM | POA: Diagnosis not present

## 2018-07-28 DIAGNOSIS — F17290 Nicotine dependence, other tobacco product, uncomplicated: Secondary | ICD-10-CM | POA: Insufficient documentation

## 2018-07-28 DIAGNOSIS — Z79899 Other long term (current) drug therapy: Secondary | ICD-10-CM | POA: Diagnosis not present

## 2018-07-28 DIAGNOSIS — R1011 Right upper quadrant pain: Secondary | ICD-10-CM | POA: Diagnosis present

## 2018-07-28 LAB — COMPREHENSIVE METABOLIC PANEL
ALBUMIN: 4.9 g/dL (ref 3.5–5.0)
ALT: 12 U/L (ref 0–44)
ANION GAP: 9 (ref 5–15)
AST: 19 U/L (ref 15–41)
Alkaline Phosphatase: 42 U/L (ref 38–126)
BUN: 9 mg/dL (ref 6–20)
CO2: 27 mmol/L (ref 22–32)
Calcium: 9.7 mg/dL (ref 8.9–10.3)
Chloride: 102 mmol/L (ref 98–111)
Creatinine, Ser: 0.82 mg/dL (ref 0.44–1.00)
GFR calc non Af Amer: >60 mL/min (ref >60)
Glucose, Bld: 89 mg/dL (ref 70–99)
Potassium: 3.2 mmol/L — ABNORMAL LOW (ref 3.5–5.1)
Sodium: 138 mmol/L (ref 135–145)
Total Bilirubin: 0.9 mg/dL (ref 0.3–1.2)
Total Protein: 8.1 g/dL (ref 6.5–8.1)

## 2018-07-28 LAB — URINALYSIS, ROUTINE W REFLEX MICROSCOPIC
BILIRUBIN URINE: NEGATIVE
Glucose, UA: NEGATIVE mg/dL
Ketones, ur: NEGATIVE mg/dL
Nitrite: NEGATIVE
PH: 7.5 (ref 5.0–8.0)
Protein, ur: NEGATIVE mg/dL
SPECIFIC GRAVITY, URINE: 1.01 (ref 1.005–1.030)

## 2018-07-28 LAB — CBC
HCT: 37.7 % (ref 36.0–46.0)
HEMOGLOBIN: 12.1 g/dL (ref 12.0–15.0)
MCH: 22.3 pg — ABNORMAL LOW (ref 26.0–34.0)
MCHC: 32.1 g/dL (ref 30.0–36.0)
MCV: 69.4 fL — ABNORMAL LOW (ref 78.0–100.0)
Platelets: 266 10*3/uL (ref 150–400)
RBC: 5.43 MIL/uL — AB (ref 3.87–5.11)
RDW: 13.7 % (ref 11.5–15.5)
WBC: 8.3 10*3/uL (ref 4.0–10.5)

## 2018-07-28 LAB — URINALYSIS, MICROSCOPIC (REFLEX)

## 2018-07-28 LAB — PREGNANCY, URINE: Preg Test, Ur: NEGATIVE

## 2018-07-28 LAB — LIPASE, BLOOD: Lipase: 26 U/L (ref 11–51)

## 2018-07-28 MED ORDER — PANTOPRAZOLE SODIUM 20 MG PO TBEC: 20.0000 mg | DELAYED_RELEASE_TABLET | Freq: Every day | ORAL | 0 refills | Status: AC

## 2018-07-28 MED ORDER — GI COCKTAIL ~~LOC~~
30.0000 mL | Freq: Once | ORAL | Status: AC
Start: 2018-07-28 — End: 2018-07-28
  Administered 2018-07-28: 30 mL via ORAL
  Filled 2018-07-28: qty 30

## 2018-07-28 MED ORDER — ONDANSETRON HCL 4 MG PO TABS: 4.0000 mg | ORAL_TABLET | Freq: Four times a day (QID) | ORAL | 0 refills | Status: AC

## 2018-07-28 MED ORDER — POTASSIUM CHLORIDE CRYS ER 20 MEQ PO TBCR
60.0000 meq | EXTENDED_RELEASE_TABLET | Freq: Once | ORAL | Status: AC
  Administered 2018-07-28: 60 meq via ORAL
  Filled 2018-07-28: qty 3

## 2018-07-28 MED ORDER — SUCRALFATE 1 GM/10ML PO SUSP
1.0000 g | Freq: Once | ORAL | Status: AC
  Administered 2018-07-28: 1 g via ORAL
  Filled 2018-07-28: qty 10

## 2018-07-28 MED ORDER — SUCRALFATE 1 GM/10ML PO SUSP: 1.0000 g | Freq: Three times a day (TID) | ORAL | 0 refills | Status: DC

## 2018-07-28 MED ORDER — ONDANSETRON HCL 4 MG/2ML IJ SOLN
4.0000 mg | Freq: Once | INTRAMUSCULAR | Status: AC
  Administered 2018-07-28: 4 mg via INTRAVENOUS
  Filled 2018-07-28: qty 2

## 2018-07-28 NOTE — ED Notes (Signed)
Pt understood dc material. NAD Noted. Scripts given electronically

## 2018-07-28 NOTE — Discharge Instructions (Signed)
Continue taking Pepcid as prescribed.  We will add a second medication for your GERD daily called Protonix.  When your symptoms are elevated, take Carafate before each meal and at bedtime.  Take Zofran every 6 hours as needed for nausea or vomiting.  Please follow-up with a gastroenterologist for further management of your symptoms.  You may need a referral from a primary care provider.  You had Dr. Alberteen Sam listed in the past.  You can follow-up with her or call the number circled on your discharge paperwork for other recommendations.  Please return the emergency department if you develop any new or worsening symptoms.

## 2018-07-28 NOTE — ED Provider Notes (Signed)
[facility] EMERGENCY DEPARTMENT Provider Note   CSN: [id number] Arrival date & time: 07/28/18  1949     History   Chief Complaint Chief Complaint  Patient presents with  . Abdominal Pain    HPI Martha Sawyer is a 27 y.o. female with history of GERD who presents with severe epigastric pain.  Patient reports she has had pain since around 3:00 after she had [employer] on her way to work.  She reports she has had persistent pain throughout the evening and vomited once.  She denies any diarrhea or bloody stools.  Her pain does not radiate. She describes her pain as a burning sensation.  She reports taking 2-40 mg tablets of famotidine prior to arrival without relief.  She has no history of gallbladder problems.  She denies any shortness of breath, chest pain, fever, abnormal vaginal bleeding or discharge, urinary symptoms.  HPI  History reviewed. No pertinent past medical history.  Patient Active Problem List   Diagnosis Date Noted  . BV (bacterial vaginosis) 06/09/2014  . Yeast vaginitis 06/09/2014  . Dysuria 02/20/2013  . Cystitis 02/20/2013    Past Surgical History:  Procedure Laterality Date  . TONSILLECTOMY       OB History   None      Home Medications    Prior to Admission medications   Medication Sig Start Date End Date Taking? Authorizing Provider  metroNIDAZOLE (FLAGYL) 500 MG tablet Take 1 tablet (500 mg total) by mouth 2 (two) times daily. 08/23/16   Mackuen, Courteney Lyn, MD  ondansetron (ZOFRAN) 4 MG tablet Take 1 tablet (4 mg total) by mouth every 6 (six) hours. 07/28/18   Durga Saldarriaga, Waylan Boga, PA-C  pantoprazole (PROTONIX) 20 MG tablet Take 1 tablet (20 mg total) by mouth daily. 07/28/18   Khaya Theissen, Waylan Boga, PA-C  predniSONE (DELTASONE) 20 MG tablet 2 tabs po daily x 7 days 02/13/17   Doug Sou, MD  sucralfate (CARAFATE) 1 GM/10ML suspension Take 10 mLs (1 g total) by mouth 4 (four) times daily -  with meals and at bedtime. 07/28/18   Emi Holes, PA-C    Family History Family History  Problem Relation Age of Onset  . Lupus Mother   . Hypertension Maternal Grandmother   . Depression Maternal Grandmother   . Diabetes Maternal Aunt   . Diabetes Maternal Uncle     Social History Social History   Tobacco Use  . Smoking status: Current Every Day Smoker    Types: Cigars  . Smokeless tobacco: Never Used  Substance Use Topics  . Alcohol use: Yes    Comment: 2x week  . Drug use: No     Allergies   Patient has no known allergies.   Review of Systems Review of Systems  Constitutional: Negative for chills and fever.  HENT: Negative for facial swelling and sore throat.   Respiratory: Negative for shortness of breath.   Cardiovascular: Negative for chest pain.  Gastrointestinal: Positive for abdominal pain, nausea and vomiting. Negative for diarrhea.  Genitourinary: Negative for dysuria.  Musculoskeletal: Negative for back pain.  Skin: Negative for rash and wound.  Neurological: Negative for headaches.  Psychiatric/Behavioral: The patient is not nervous/anxious.      Physical Exam Updated Vital Signs BP 113/66   Pulse 94   Temp 99.3 F (37.4 C) (Oral)   Resp 18   Ht 5' 6.5" (1.689 m)   Wt 74.4 kg   LMP 07/15/2018   SpO2 100%   BMI  26.07 kg/m   Physical Exam  Constitutional: She appears well-developed and well-nourished. No distress.  HENT:  Head: Normocephalic and atraumatic.  Mouth/Throat: Oropharynx is clear and moist. No oropharyngeal exudate.  Eyes: Pupils are equal, round, and reactive to light. Conjunctivae are normal. Right eye exhibits no discharge. Left eye exhibits no discharge. No scleral icterus.  Neck: Normal range of motion. Neck supple. No thyromegaly present.  Cardiovascular: Normal rate, regular rhythm, normal heart sounds and intact distal pulses. Exam reveals no gallop and no friction rub.  No murmur heard. Pulmonary/Chest: Effort normal and breath sounds normal. No  stridor. No respiratory distress. She has no wheezes. She has no rales.  Abdominal: Soft. Bowel sounds are normal. She exhibits no distension. There is tenderness in the right upper quadrant and epigastric area. There is positive Murphy's sign. There is no rigidity, no rebound, no guarding, no CVA tenderness and no tenderness at McBurney's point.  Musculoskeletal: She exhibits no edema.  Lymphadenopathy:    She has no cervical adenopathy.  Neurological: She is alert. Coordination normal.  Skin: Skin is warm and dry. No rash noted. She is not diaphoretic. No pallor.  Psychiatric: She has a normal mood and affect.  Nursing note and vitals reviewed.    ED Treatments / Results  Labs (all labs ordered are listed, but only abnormal results are displayed) Labs Reviewed  COMPREHENSIVE METABOLIC PANEL - Abnormal; Notable for the following components:      Result Value   Potassium 3.2 (*)    All other components within normal limits  CBC - Abnormal; Notable for the following components:   RBC 5.43 (*)    MCV 69.4 (*)    MCH 22.3 (*)    All other components within normal limits  URINALYSIS, ROUTINE W REFLEX MICROSCOPIC - Abnormal; Notable for the following components:   Hgb urine dipstick MODERATE (*)    Leukocytes, UA SMALL (*)    All other components within normal limits  URINALYSIS, MICROSCOPIC (REFLEX) - Abnormal; Notable for the following components:   Bacteria, UA MANY (*)    All other components within normal limits  LIPASE, BLOOD  PREGNANCY, URINE    EKG None  Radiology US Abdomen Limited Ruq  Result Date: 07/28/2018 CLINICAL DATA:  Increased epigastric pain. EXAM: ULTRASOUND ABDOMEN LIMITED RIGHT UPPER QUADRANT COMPARISON:  None. FINDINGS: Gallbladder: No gallstones or gallbladder wall thickening. No pericholecystic fluid. The sonographer reports no sonographic Murphy's sign. Common bile duct: Diameter: 2 mm. Liver: No focal lesion identified. Within normal limits in  parenchymal echogenicity. Portal vein is patent on color Doppler imaging with normal direction of blood flow towards the liver. IMPRESSION: Unremarkable right upper quadrant ultrasound. Electronically Signed   By: Kennith Center M.D.   On: 07/28/2018 20:48    Procedures Procedures (including critical care time)  Medications Ordered in ED Medications  ondansetron [facility]) injection 4 mg (4 mg Intravenous Given 07/28/18 2045)  gi cocktail (Maalox,Lidocaine,Donnatal) (30 mLs Oral Given 07/28/18 2058)  potassium chloride SA (K-DUR,KLOR-CON) CR tablet 60 mEq (60 mEq Oral Given 07/28/18 2119)  sucralfate (CARAFATE) 1 GM/10ML suspension 1 g (1 g Oral Given 07/28/18 2120)     Initial Impression / Assessment and Plan / ED Course  I have reviewed the triage vital signs and the nursing notes.  Pertinent labs & imaging results that were available during my care of the patient were reviewed by me and considered in my medical decision making (see chart for details).     Patient  with suspected flare of GERD.  Patient is feeling better after Zofran and GI cocktail in the ED.  She was also given Carafate prior to discharge.  Labs are unremarkable except for mild hypokalemia which was replaced in the ED.  Right upper quadrant ultrasound is negative.  UA shows moderate hematuria, small leukocytes, many bacteria, 21-50 epithelial cells.  Suspect dirty sample.  Patient denies any urinary symptoms.  Will discharge home with continued Pepcid, add Protonix, Carafate and Zofran as needed.  Diet modification discussed.  Refer to GI for further work-up.  Strict return precautions given.  Patient understands and agrees with plan.  Patient vitals stable throughout ED course and discharged in satisfactory condition.  Final Clinical Impressions(s) / ED Diagnoses   Final diagnoses:  RUQ pain  Non-intractable vomiting with nausea, unspecified vomiting type    ED Discharge Orders         Ordered    ondansetron (ZOFRAN) 4  MG tablet  Every 6 hours     07/28/18 2111    pantoprazole (PROTONIX) 20 MG tablet  Daily     07/28/18 2111    sucralfate (CARAFATE) 1 GM/10ML suspension  3 times daily with meals & bedtime     07/28/18 2111           Emi Holes, PA-C 07/28/18 2138    Gwyneth Sprout, MD 07/29/18 2156

## 2018-07-28 NOTE — ED Triage Notes (Signed)
Pt c/o abd pain with n/v since 3pm today

## 2018-07-28 NOTE — ED Notes (Signed)
Patient transported to Ultrasound 

## 2018-11-18 ENCOUNTER — Emergency Department (HOSPITAL_BASED_OUTPATIENT_CLINIC_OR_DEPARTMENT_OTHER): Payer: Managed Care, Other (non HMO)

## 2018-11-18 ENCOUNTER — Other Ambulatory Visit: Payer: Self-pay

## 2018-11-18 ENCOUNTER — Emergency Department (HOSPITAL_BASED_OUTPATIENT_CLINIC_OR_DEPARTMENT_OTHER)
Admission: EM | Admit: 2018-11-18 | Discharge: 2018-11-18 | Disposition: A | Discharge status: Home / Self Care | Payer: Managed Care, Other (non HMO) | Attending: Emergency Medicine | Admitting: Emergency Medicine

## 2018-11-18 ENCOUNTER — Encounter (HOSPITAL_BASED_OUTPATIENT_CLINIC_OR_DEPARTMENT_OTHER): Payer: Self-pay | Admitting: Emergency Medicine

## 2018-11-18 DIAGNOSIS — J111 Influenza due to unidentified influenza virus with other respiratory manifestations: Secondary | ICD-10-CM | POA: Diagnosis not present

## 2018-11-18 DIAGNOSIS — F17290 Nicotine dependence, other tobacco product, uncomplicated: Secondary | ICD-10-CM | POA: Insufficient documentation

## 2018-11-18 DIAGNOSIS — R05 Cough: Secondary | ICD-10-CM | POA: Diagnosis present

## 2018-11-18 DIAGNOSIS — R059 Cough, unspecified: Secondary | ICD-10-CM

## 2018-11-18 DIAGNOSIS — R69 Illness, unspecified: Secondary | ICD-10-CM

## 2018-11-18 DIAGNOSIS — Z79899 Other long term (current) drug therapy: Secondary | ICD-10-CM | POA: Diagnosis not present

## 2018-11-18 MED ORDER — OSELTAMIVIR PHOSPHATE 75 MG PO CAPS
75.0000 mg | ORAL_CAPSULE | Freq: Once | ORAL | Status: AC
  Administered 2018-11-18: 75 mg via ORAL
  Filled 2018-11-18: qty 1

## 2018-11-18 MED ORDER — OSELTAMIVIR PHOSPHATE 75 MG PO CAPS: 75.0000 mg | ORAL_CAPSULE | Freq: Two times a day (BID) | ORAL | 0 refills | Status: AC

## 2018-11-18 MED ORDER — ACETAMINOPHEN 325 MG PO TABS
650.0000 mg | ORAL_TABLET | Freq: Once | ORAL | Status: AC | PRN
  Administered 2018-11-18: 650 mg via ORAL
  Filled 2018-11-18: qty 2

## 2018-11-18 NOTE — ED Provider Notes (Signed)
[facility] EMERGENCY DEPARTMENT Provider Note   CSN: [id number] Arrival date & time: 11/18/18  1325     History   Chief Complaint Chief Complaint  Patient presents with  . Cough    HPI Martha Sawyer is a 28 y.o. female.  The history is provided by the patient and medical records. No language interpreter was used.  URI  Presenting symptoms: congestion, cough, fatigue, fever and rhinorrhea   Severity:  Severe Onset quality:  Gradual Duration:  1 day Timing:  Constant Progression:  Waxing and waning Chronicity:  New Relieved by:  Nothing Worsened by:  Nothing Associated symptoms: no headaches, no neck pain and no wheezing   Risk factors: sick contacts     History reviewed. No pertinent past medical history.  Patient Active Problem List   Diagnosis Date Noted  . BV (bacterial vaginosis) 06/09/2014  . Yeast vaginitis 06/09/2014  . Dysuria 02/20/2013  . Cystitis 02/20/2013    Past Surgical History:  Procedure Laterality Date  . TONSILLECTOMY       OB History   No obstetric history on file.      Home Medications    Prior to Admission medications   Medication Sig Start Date End Date Taking? Authorizing Provider  metroNIDAZOLE (FLAGYL) 500 MG tablet Take 1 tablet (500 mg total) by mouth 2 (two) times daily. 08/23/16   Mackuen, Courteney Lyn, MD  ondansetron (ZOFRAN) 4 MG tablet Take 1 tablet (4 mg total) by mouth every 6 (six) hours. 07/28/18   Law, Waylan BogaAlexandra M, PA-C  pantoprazole (PROTONIX) 20 MG tablet Take 1 tablet (20 mg total) by mouth daily. 07/28/18   Law, Waylan BogaAlexandra M, PA-C  predniSONE (DELTASONE) 20 MG tablet 2 tabs po daily x 7 days 02/13/17   Doug SouJacubowitz, Sam, MD  sucralfate (CARAFATE) 1 GM/10ML suspension Take 10 mLs (1 g total) by mouth 4 (four) times daily -  with meals and at bedtime. 07/28/18   Emi HolesLaw, Alexandra M, PA-C    Family History Family History  Problem Relation Age of Onset  . Lupus Mother   . Hypertension Maternal Grandmother     . Depression Maternal Grandmother   . Diabetes Maternal Aunt   . Diabetes Maternal Uncle     Social History Social History   Tobacco Use  . Smoking status: Current Every Day Smoker    Types: Cigars  . Smokeless tobacco: Never Used  Substance Use Topics  . Alcohol use: Yes    Comment: 2x week  . Drug use: No     Allergies   Patient has no known allergies.   Review of Systems Review of Systems  Constitutional: Positive for chills, fatigue and fever.  HENT: Positive for congestion and rhinorrhea.   Eyes: Negative for visual disturbance.  Respiratory: Positive for cough. Negative for chest tightness, shortness of breath and wheezing.   Cardiovascular: Negative for chest pain, palpitations and leg swelling.  Gastrointestinal: Negative for blood in stool, diarrhea, nausea and vomiting.  Genitourinary: Negative for flank pain.  Musculoskeletal: Negative for back pain, neck pain and neck stiffness.  Skin: Negative for rash and wound.  Neurological: Negative for weakness, light-headedness and headaches.  Psychiatric/Behavioral: Negative for agitation.  All other systems reviewed and are negative.    Physical Exam Updated Vital Signs BP 125/80 (BP Location: Left Arm)   Pulse (!) 121   Temp (!) 101.9 F (38.8 C) (Oral)   Resp 20   Ht 5\' 6"  (1.676 m)   Wt 68.9 kg  LMP 11/08/2018   SpO2 100%   BMI 24.53 kg/m   Physical Exam Vitals signs and nursing note reviewed.  Constitutional:      General: She is not in acute distress.    Appearance: She is well-developed. She is not ill-appearing, toxic-appearing or diaphoretic.  HENT:     Head: Normocephalic and atraumatic.     Nose: Congestion and rhinorrhea present.     Mouth/Throat:     Pharynx: No oropharyngeal exudate or posterior oropharyngeal erythema.  Eyes:     Conjunctiva/sclera: Conjunctivae normal.     Pupils: Pupils are equal, round, and reactive to light.  Neck:     Musculoskeletal: Neck supple. No neck  rigidity or muscular tenderness.  Cardiovascular:     Rate and Rhythm: Regular rhythm. Tachycardia present.     Heart sounds: No murmur.  Pulmonary:     Effort: Pulmonary effort is normal. No respiratory distress.     Breath sounds: Normal breath sounds. No wheezing, rhonchi or rales.  Chest:     Chest wall: No tenderness.  Abdominal:     Palpations: Abdomen is soft.     Tenderness: There is no abdominal tenderness. There is no guarding.  Musculoskeletal:        General: No tenderness.     Right lower leg: No edema.     Left lower leg: No edema.  Skin:    General: Skin is warm and dry.     Capillary Refill: Capillary refill takes less than 2 seconds.  Neurological:     General: No focal deficit present.     Mental Status: She is alert.  Psychiatric:        Mood and Affect: Mood normal.      ED Treatments / Results  Labs (all labs ordered are listed, but only abnormal results are displayed) Labs Reviewed - No data to display  EKG None  Radiology Dg Chest 2 View  Result Date: 11/18/2018 CLINICAL DATA:  Cough, congestion, fever, and headache EXAM: CHEST - 2 VIEW COMPARISON:  None. FINDINGS: Normal heart size and mediastinal contours. No acute infiltrate or edema. No effusion or pneumothorax. No acute osseous findings. IMPRESSION: Negative chest. Electronically Signed   By: Marnee Spring M.D.   On: 11/18/2018 13:59    Procedures Procedures (including critical care time)  Medications Ordered in ED Medications  acetaminophen (TYLENOL) tablet 650 mg (650 mg Oral Given 11/18/18 1355)  oseltamivir (TAMIFLU) capsule 75 mg (75 mg Oral Given 11/18/18 1509)     Initial Impression / Assessment and Plan / ED Course  I have reviewed the triage vital signs and the nursing notes.  Pertinent labs & imaging results that were available during my care of the patient were reviewed by me and considered in my medical decision making (see chart for details).     Martha Sawyer is a  28 y.o. 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  Patient reports that her symptoms began yesterday and have been severe.  She reports that she had a fever of 102 yesterday and took Tylenol.  She reports has had some nausea and vomiting.  No constipation or diarrhea.  No urinary symptoms.  No neck pain or neck stiffness.  General malaise.  Nonproductive cough.  Patient reports she had a flu shot months ago but there have been flu contacts.  On exam, lungs are clear.  Chest is nontender abdomen is nontender.  No rashes.  Normal neck mobility.  No abnormality on oropharyngeal exam.  Patient resting comfortably.  On arrival, temperature was one 1.9 and patient was tachycardic at 121.  After Tylenol, heart rate has improved.  Patient is feeling better with fever treatment.  High suspicion for influenza given her exposures.  As patient presented in the last 24 hours of symptoms, patient will be treated with Tamiflu.  Shared decision made conversation held and we will not pursue actual influenza testing at this time given high suspicion.  Patient will give prescription for Tamiflu.  Patient will be p.o. challenge.  If vitals improved and patient is feeling better after fever treatment, patient will be discharged home with instructions for rest and PCP follow-up and return precautions.  Anticipate discharge.  3:58 PM Patient was able tolerate the Tamiflu and p.o. challenge.  Patient was feeling better.  Vitals improved.  Patient will be discharged for outpatient management of likely influenza.  Patient understood return precautions and follow instructions.  Patient discharged in good condition.   Final Clinical Impressions(s) / ED Diagnoses   Final diagnoses:  Influenza-like illness  Cough    ED Discharge Orders         Ordered    oseltamivir (TAMIFLU) 75 MG capsule  Every  12 hours     11/18/18 1600         Clinical Impression: 1. Influenza-like illness   2. Cough     Disposition: Discharge  Condition: Good  I have discussed the results, Dx and Tx plan with the pt(& family if present). He/she/they expressed understanding and agree(s) with the plan. Discharge instructions discussed at great length. Strict return precautions discussed and pt &/or family have verbalized understanding of the instructions. No further questions at time of discharge.    New Prescriptions   OSELTAMIVIR (TAMIFLU) 75 MG CAPSULE    Take 1 capsule (75 mg total) by mouth every 12 (twelve) hours.    Follow Up: [facility] [address] [place] [address] [phone] Schedule an appointment as soon as possible for a visit    [facility] [place] [facility] [address] [id number] UV [address] [place] [address] [phone]         Kassady Laboy, Canary Brim, MD 11/18/18 [phone]

## 2018-11-18 NOTE — Discharge Instructions (Signed)
Your history and exam today are consistent with influenza that has been going through the community.  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  X-ray did not show pneumonia.  Your symptoms improved with fever treatment.  Please stay hydrated and rest.  If any symptoms change or worsen, is return to the nearest emergency department.  Please follow-up with your PCP.

## 2018-11-18 NOTE — ED Triage Notes (Signed)
Patient states that she started to have a cough and congestion last night with a headache  - the patient states that she feels horrible today

## 2018-11-18 NOTE — ED Notes (Signed)
Patient transported to X-ray 

## 2020-05-05 IMAGING — CR DG CHEST 2V
2 series · 2 of 2 positions shown · non-contrast
Comparison: None.

CLINICAL DATA: Cough, congestion, fever, and headache

EXAM:
CHEST - 2 VIEW

[w chest pa]
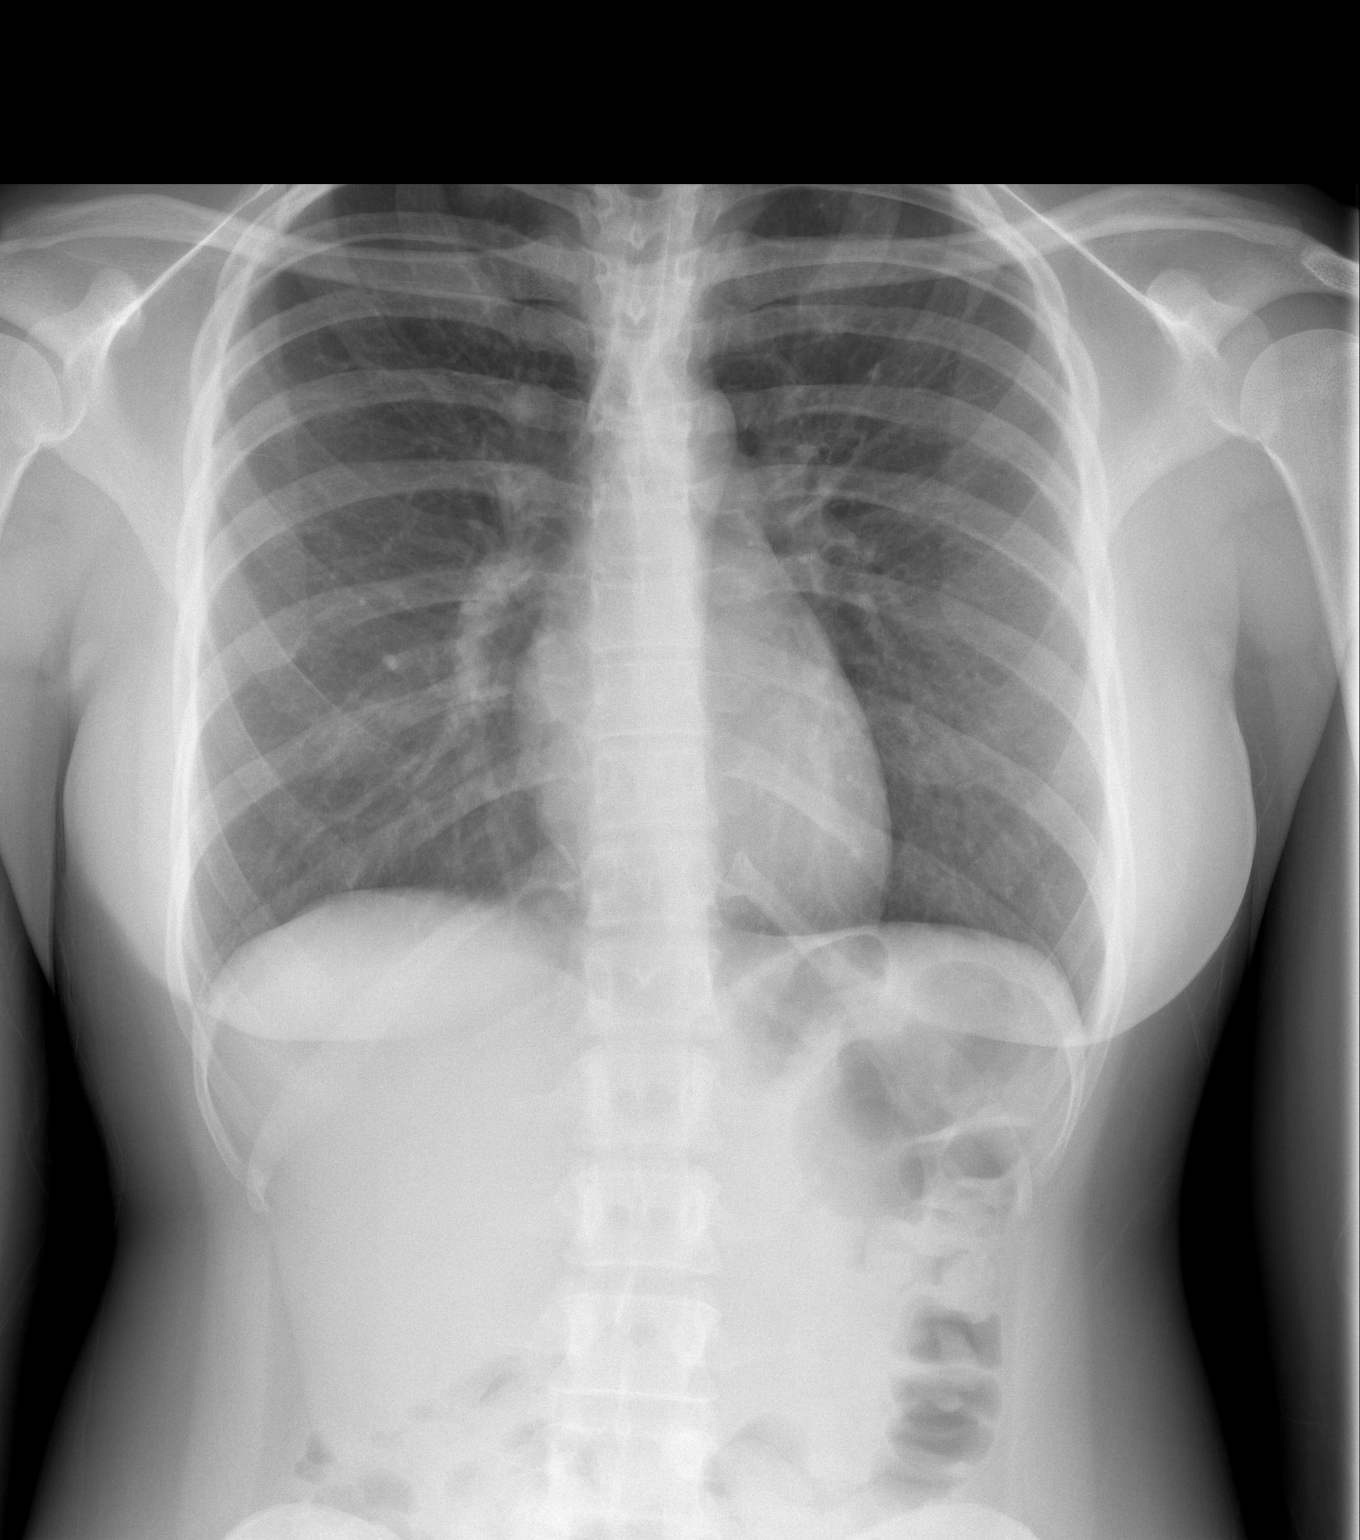

[w chest lat]
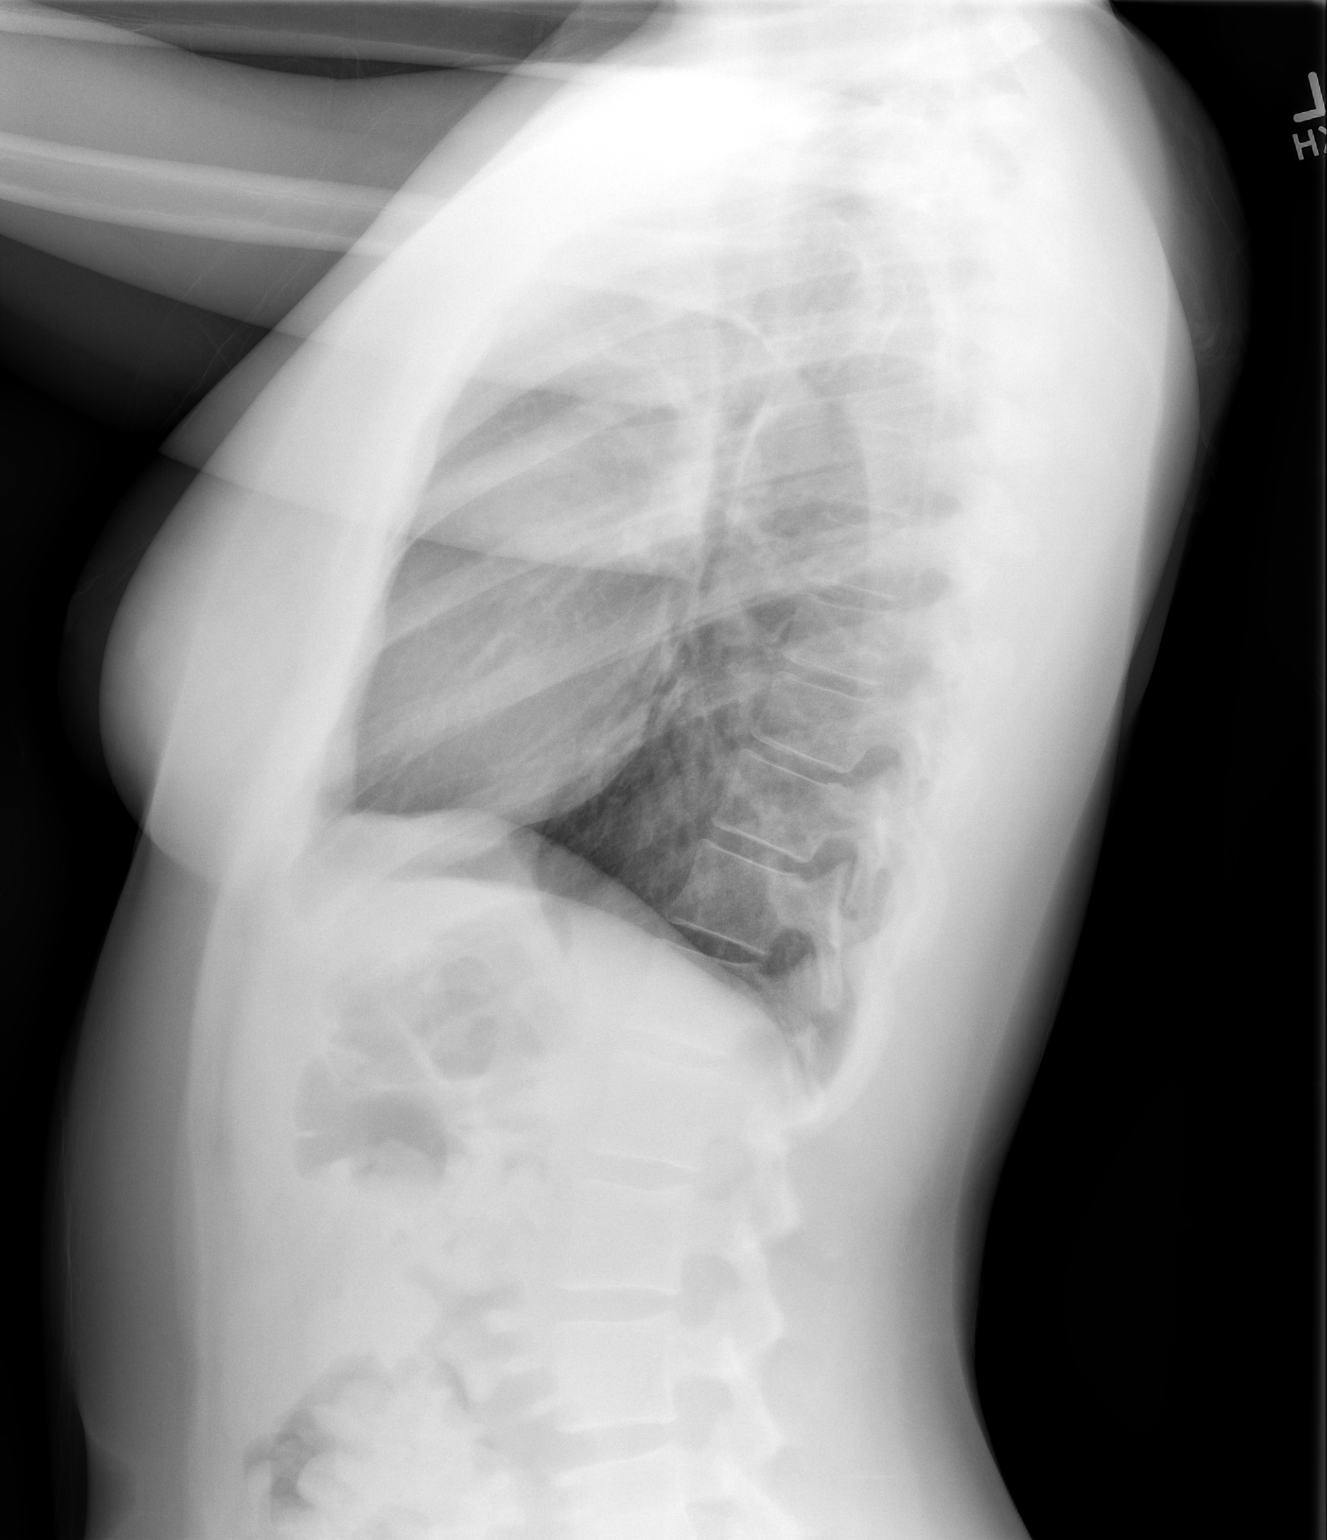

[2 of 2 positions shown; findings below may reference images not displayed]

FINDINGS: Normal heart size and mediastinal contours. No acute infiltrate or
edema. No effusion or pneumothorax. No acute osseous findings.
IMPRESSION: Negative chest.

## 2020-10-17 ENCOUNTER — Emergency Department (HOSPITAL_BASED_OUTPATIENT_CLINIC_OR_DEPARTMENT_OTHER): Payer: Self-pay

## 2020-10-17 ENCOUNTER — Emergency Department (HOSPITAL_BASED_OUTPATIENT_CLINIC_OR_DEPARTMENT_OTHER)
Admission: EM | Admit: 2020-10-17 | Discharge: 2020-10-17 | Disposition: A | Discharge status: Home / Self Care | Payer: Self-pay | Attending: Emergency Medicine | Admitting: Emergency Medicine

## 2020-10-17 ENCOUNTER — Encounter (HOSPITAL_BASED_OUTPATIENT_CLINIC_OR_DEPARTMENT_OTHER): Payer: Self-pay | Admitting: Emergency Medicine

## 2020-10-17 ENCOUNTER — Other Ambulatory Visit: Payer: Self-pay

## 2020-10-17 DIAGNOSIS — Z87891 Personal history of nicotine dependence: Secondary | ICD-10-CM | POA: Insufficient documentation

## 2020-10-17 DIAGNOSIS — R110 Nausea: Secondary | ICD-10-CM | POA: Insufficient documentation

## 2020-10-17 DIAGNOSIS — R1013 Epigastric pain: Secondary | ICD-10-CM | POA: Insufficient documentation

## 2020-10-17 LAB — CBC WITH DIFFERENTIAL/PLATELET
Abs Immature Granulocytes: 0.01 10*3/uL (ref 0.00–0.07)
Basophils Absolute: 0 10*3/uL (ref 0.0–0.1)
Basophils Relative: 0 %
Eosinophils Absolute: 0 10*3/uL (ref 0.0–0.5)
Eosinophils Relative: 0 %
HCT: 37.7 % (ref 36.0–46.0)
Hemoglobin: 12.1 g/dL (ref 12.0–15.0)
Immature Granulocytes: 0 %
Lymphocytes Relative: 23 %
Lymphs Abs: 1 10*3/uL (ref 0.7–4.0)
MCH: 23 pg — ABNORMAL LOW (ref 26.0–34.0)
MCHC: 32.1 g/dL (ref 30.0–36.0)
MCV: 71.8 fL — ABNORMAL LOW (ref 80.0–100.0)
Monocytes Absolute: 0.5 10*3/uL (ref 0.1–1.0)
Monocytes Relative: 11 %
Neutro Abs: 2.8 10*3/uL (ref 1.7–7.7)
Neutrophils Relative %: 66 %
Platelets: 208 10*3/uL (ref 150–400)
RBC: 5.25 MIL/uL — ABNORMAL HIGH (ref 3.87–5.11)
RDW: 13.5 % (ref 11.5–15.5)
WBC: 4.3 10*3/uL (ref 4.0–10.5)
nRBC: 0 % (ref 0.0–0.2)

## 2020-10-17 LAB — COMPREHENSIVE METABOLIC PANEL
ALT: 16 U/L (ref 0–44)
AST: 19 U/L (ref 15–41)
Albumin: 4.2 g/dL (ref 3.5–5.0)
Alkaline Phosphatase: 35 U/L — ABNORMAL LOW (ref 38–126)
Anion gap: 9 (ref 5–15)
BUN: 9 mg/dL (ref 6–20)
CO2: 23 mmol/L (ref 22–32)
Calcium: 9 mg/dL (ref 8.9–10.3)
Chloride: 102 mmol/L (ref 98–111)
Creatinine, Ser: 0.75 mg/dL (ref 0.44–1.00)
GFR, Estimated: >60 mL/min (ref >60)
Glucose, Bld: 106 mg/dL — ABNORMAL HIGH (ref 70–99)
Potassium: 3.2 mmol/L — ABNORMAL LOW (ref 3.5–5.1)
Sodium: 134 mmol/L — ABNORMAL LOW (ref 135–145)
Total Bilirubin: 0.4 mg/dL (ref 0.3–1.2)
Total Protein: 7.1 g/dL (ref 6.5–8.1)

## 2020-10-17 LAB — PREGNANCY, URINE: Preg Test, Ur: NEGATIVE

## 2020-10-17 LAB — URINALYSIS, ROUTINE W REFLEX MICROSCOPIC
Bilirubin Urine: NEGATIVE
Glucose, UA: NEGATIVE mg/dL
Ketones, ur: NEGATIVE mg/dL
Leukocytes,Ua: NEGATIVE
Nitrite: NEGATIVE
Protein, ur: NEGATIVE mg/dL
Specific Gravity, Urine: 1.03 (ref 1.005–1.030)
pH: 6 (ref 5.0–8.0)

## 2020-10-17 LAB — URINALYSIS, MICROSCOPIC (REFLEX)

## 2020-10-17 LAB — LIPASE, BLOOD: Lipase: 19 U/L (ref 11–51)

## 2020-10-17 MED ORDER — ESOMEPRAZOLE MAGNESIUM 40 MG PO CPDR: 40.0000 mg | DELAYED_RELEASE_CAPSULE | Freq: Every day | ORAL | 1 refills | Status: AC

## 2020-10-17 MED ORDER — ONDANSETRON 4 MG PO TBDP
4.0000 mg | ORAL_TABLET | Freq: Once | ORAL | Status: AC | PRN
  Administered 2020-10-17: 4 mg via ORAL
  Filled 2020-10-17: qty 1

## 2020-10-17 MED ORDER — SODIUM CHLORIDE 0.9 % IV BOLUS
1000.0000 mL | Freq: Once | INTRAVENOUS | Status: AC
  Administered 2020-10-17: 1000 mL via INTRAVENOUS

## 2020-10-17 MED ORDER — SUCRALFATE 1 G PO TABS: 1.0000 g | ORAL_TABLET | Freq: Three times a day (TID) | ORAL | 0 refills | Status: AC

## 2020-10-17 MED ORDER — LIDOCAINE VISCOUS HCL 2 % MT SOLN
15.0000 mL | Freq: Once | OROMUCOSAL | Status: AC
  Administered 2020-10-17: 15 mL via ORAL
  Filled 2020-10-17: qty 15

## 2020-10-17 MED ORDER — ALUM & MAG HYDROXIDE-SIMETH 200-200-20 MG/5ML PO SUSP
30.0000 mL | Freq: Once | ORAL | Status: AC
  Administered 2020-10-17: 30 mL via ORAL
  Filled 2020-10-17: qty 30

## 2020-10-17 MED ORDER — PANTOPRAZOLE SODIUM 40 MG PO TBEC
40.0000 mg | DELAYED_RELEASE_TABLET | Freq: Once | ORAL | Status: AC
  Administered 2020-10-17: 40 mg via ORAL
  Filled 2020-10-17: qty 1

## 2020-10-17 NOTE — ED Provider Notes (Signed)
[facility] EMERGENCY DEPARTMENT Provider Note   CSN: [id number] Arrival date & time: 10/17/20  1939     History Chief Complaint  Patient presents with  . Nausea    Martha Sawyer is a 29 y.o. female here presenting with nausea and abdominal cramps.  She has intermittent abdominal cramps for the last year or so.  She states that she is chronically constipated.  She tried some Dulcolax yesterday and some chewable fibers today.  She states that she did have 7 bowel movements afterwards.  Afterwards the cramps got worse and she has some nausea and associated heartburn.  She then took some Pepcid prior to arrival.  She states that this has been going on for a while and she was seen in the ED previously.  However she never had endoscopy or saw GI doctor.  Denies any fevers or chills or chest pain.  The history is provided by the patient.       History reviewed. No pertinent past medical history.  Patient Active Problem List   Diagnosis Date Noted  . BV (bacterial vaginosis) 06/09/2014  . Yeast vaginitis 06/09/2014  . Dysuria 02/20/2013  . Cystitis 02/20/2013    Past Surgical History:  Procedure Laterality Date  . TONSILLECTOMY       OB History   No obstetric history on file.     Family History  Problem Relation Age of Onset  . Lupus Mother   . Hypertension Maternal Grandmother   . Depression Maternal Grandmother   . Diabetes Maternal Aunt   . Diabetes Maternal Uncle     Social History   Tobacco Use  . Smoking status: Former Smoker    Types: Cigars  . Smokeless tobacco: Never Used  Vaping Use  . Vaping Use: Never used  Substance Use Topics  . Alcohol use: Yes    Comment: 2x week  . Drug use: No    Home Medications Prior to Admission medications   Medication Sig Start Date End Date Taking? Authorizing Provider  metroNIDAZOLE (FLAGYL) 500 MG tablet Take 1 tablet (500 mg total) by mouth 2 (two) times daily. 08/23/16   Mackuen, Courteney Lyn, MD   ondansetron (ZOFRAN) 4 MG tablet Take 1 tablet (4 mg total) by mouth every 6 (six) hours. 07/28/18   Law, Waylan Boga, PA-C  oseltamivir (TAMIFLU) 75 MG capsule Take 1 capsule (75 mg total) by mouth every 12 (twelve) hours. 11/18/18   Tegeler, Canary Brim, MD  pantoprazole (PROTONIX) 20 MG tablet Take 1 tablet (20 mg total) by mouth daily. 07/28/18   Law, Waylan Boga, PA-C  predniSONE (DELTASONE) 20 MG tablet 2 tabs po daily x 7 days 02/13/17   Doug Sou, MD  sucralfate (CARAFATE) 1 GM/10ML suspension Take 10 mLs (1 g total) by mouth 4 (four) times daily -  with meals and at bedtime. 07/28/18   Emi Holes, PA-C    Allergies    Patient has no known allergies.  Review of Systems   Review of Systems  Gastrointestinal: Positive for abdominal pain.  All other systems reviewed and are negative.   Physical Exam Updated Vital Signs BP 110/66 (BP Location: Left Arm)   Pulse 69   Temp 98.5 F (36.9 C) (Oral)   Resp 20   Ht 5' 6.5" (1.689 m)   Wt 62.1 kg   LMP 09/26/2020   SpO2 100%   BMI 21.78 kg/m   Physical Exam Vitals and nursing note reviewed.  Constitutional:  Appearance: Normal appearance.  HENT:     Head: Normocephalic.     Nose: Nose normal.     Mouth/Throat:     Mouth: Mucous membranes are moist.  Eyes:     Extraocular Movements: Extraocular movements intact.     Pupils: Pupils are equal, round, and reactive to light.  Cardiovascular:     Rate and Rhythm: Normal rate and regular rhythm.     Pulses: Normal pulses.     Heart sounds: Normal heart sounds.  Pulmonary:     Effort: Pulmonary effort is normal.     Breath sounds: Normal breath sounds.  Abdominal:     General: Abdomen is flat.     Comments: + epigastric tenderness   Musculoskeletal:        General: Normal range of motion.     Cervical back: Normal range of motion and neck supple.  Skin:    General: Skin is warm.     Capillary Refill: Capillary refill takes less than 2 seconds.   Neurological:     General: No focal deficit present.     Mental Status: She is alert and oriented to person, place, and time.  Psychiatric:        Mood and Affect: Mood normal.        Behavior: Behavior normal.     ED Results / Procedures / Treatments   Labs (all labs ordered are listed, but only abnormal results are displayed) Labs Reviewed  URINALYSIS, ROUTINE W REFLEX MICROSCOPIC - Abnormal; Notable for the following components:      Result Value   Hgb urine dipstick LARGE (*)    All other components within normal limits  CBC WITH DIFFERENTIAL/PLATELET - Abnormal; Notable for the following components:   RBC 5.25 (*)    MCV 71.8 (*)    MCH 23.0 (*)    All other components within normal limits  COMPREHENSIVE METABOLIC PANEL - Abnormal; Notable for the following components:   Sodium 134 (*)    Potassium 3.2 (*)    Glucose, Bld 106 (*)    Alkaline Phosphatase 35 (*)    All other components within normal limits  URINALYSIS, MICROSCOPIC (REFLEX) - Abnormal; Notable for the following components:   Bacteria, UA RARE (*)    All other components within normal limits  PREGNANCY, URINE  LIPASE, BLOOD    EKG None  Radiology DG ABD ACUTE 2+V W 1V CHEST  Result Date: 10/17/2020 CLINICAL DATA:  29 year old female with epigastric pain. Constipation, recently treated with laxative. EXAM: DG ABDOMEN ACUTE WITH 1 VIEW CHEST COMPARISON:  Report of [facility] CT Abdomen and Pelvis 09/26/2013 (no images available). FINDINGS: PA view of the chest. Lung volumes and mediastinal contours are normal. Visualized tracheal air column is within normal limits. Both lungs are clear. No pneumothorax or pneumoperitoneum. Upright and supine views of the abdomen and pelvis. Non obstructed bowel gas pattern. No significant retained stool. Abdominal and pelvic visceral contours are within normal limits; small left central pelvic phleboliths suspected. Transitional  lumbosacral anatomy. Mild thoracolumbar scoliosis. No acute osseous abnormality identified. IMPRESSION: 1. Normal bowel gas pattern.  No retained stool.  No free air. 2. Negative chest. Electronically Signed   By: Odessa Fleming M.D.   On: 10/17/2020 21:16    Procedures Procedures (including critical care time)  Medications Ordered in ED Medications  ondansetron (ZOFRAN-ODT) disintegrating tablet 4 mg (4 mg Oral Given 10/17/20 1957)  sodium chloride 0.9 % bolus  1,000 mL (1,000 mLs Intravenous New Bag/Given 10/17/20 2052)  pantoprazole (PROTONIX) EC tablet 40 mg (40 mg Oral Given 10/17/20 2050)  alum & mag hydroxide-simeth (MAALOX/MYLANTA) 200-200-20 MG/5ML suspension 30 mL (30 mLs Oral Given 10/17/20 2049)    And  lidocaine (XYLOCAINE) 2 % viscous mouth solution 15 mL (15 mLs Oral Given 10/17/20 2049)    ED Course  I have reviewed the triage vital signs and the nursing notes.  Pertinent labs & imaging results that were available during my care of the patient were reviewed by me and considered in my medical decision making (see chart for details).    MDM Rules/Calculators/A&P                          Yamili Wysocki is a 29 y.o. female here presenting with epigastric pain.  Patient has epigastric pain for the last year or so.  She is constipated and took laxatives and now has worsening cramps.  I think likely medication side effect versus gastritis versus gastric ulcers.  Plan to get CBC, CMP, lipase, urinalysis, pregnancy test.  Will get acute abdominal series as well.  I told her that she will likely need GI referral for endoscopy  10:12 PM  Labs unremarkable. UA nl. Xray unremarkable.  Will refer to GI outpatient.  Will start on PPI and Carafate  Final Clinical Impression(s) / ED Diagnoses Final diagnoses:  None    Rx / DC Orders ED Discharge Orders    None       Charlynne Pander, MD 10/17/20 2218

## 2020-10-17 NOTE — Discharge Instructions (Signed)
Take nexium daily   Take carafate with meals   Avoid any spicy food   You need to see GI doctor for possible endoscopy   Return to ER if you have worse abdominal pain, vomiting

## 2020-10-17 NOTE — ED Triage Notes (Addendum)
Pt states constipation and treated with 2 dulcolax last night and OTC fiber chewables. Today she states she has had good results from the laxative. C/o heartburn and nausea. Denies vomiting. Did not take anything at home for nausea, took famotidine x1 for heartburn.

## 2022-04-04 IMAGING — DX DG ABDOMEN ACUTE W/ 1V CHEST
3 series · 3 of 3 positions shown · non-contrast
Comparison: Report of [facility]ACTED[facility] CT Abdomen and Pelvis 09/26/2013 (no images available).

CLINICAL DATA: 29-year-old female with epigastric pain.
Constipation, recently treated with laxative.

EXAM:
DG ABDOMEN ACUTE WITH 1 VIEW CHEST

[chest pa]
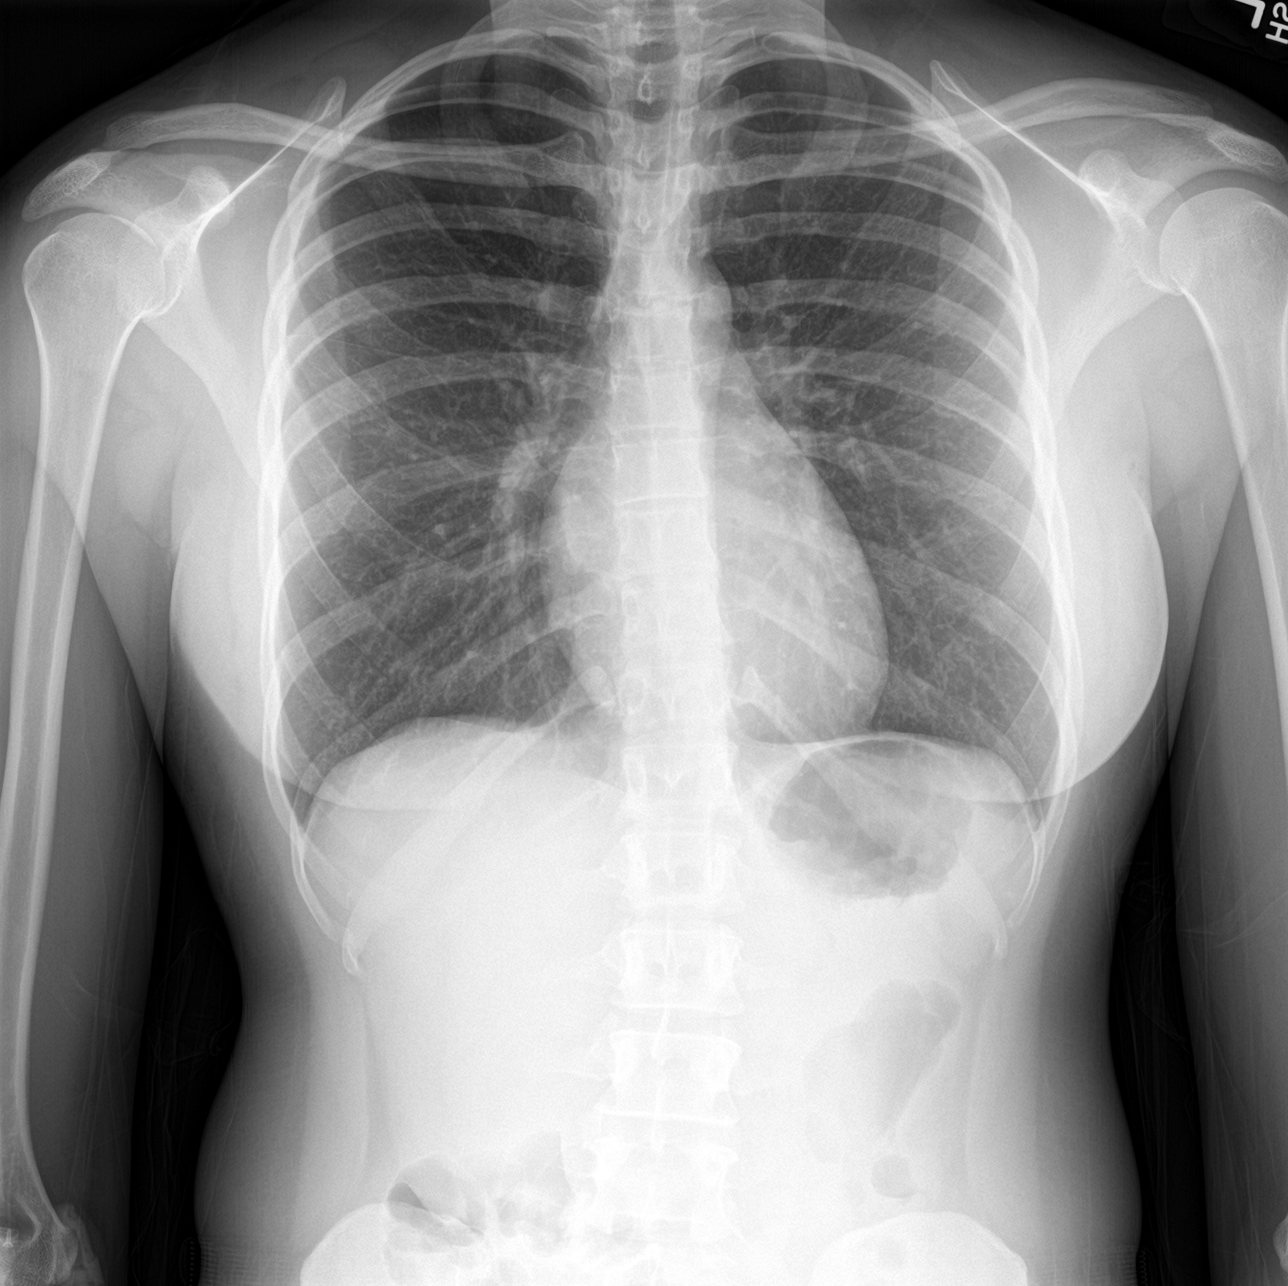

[abdomen supine]
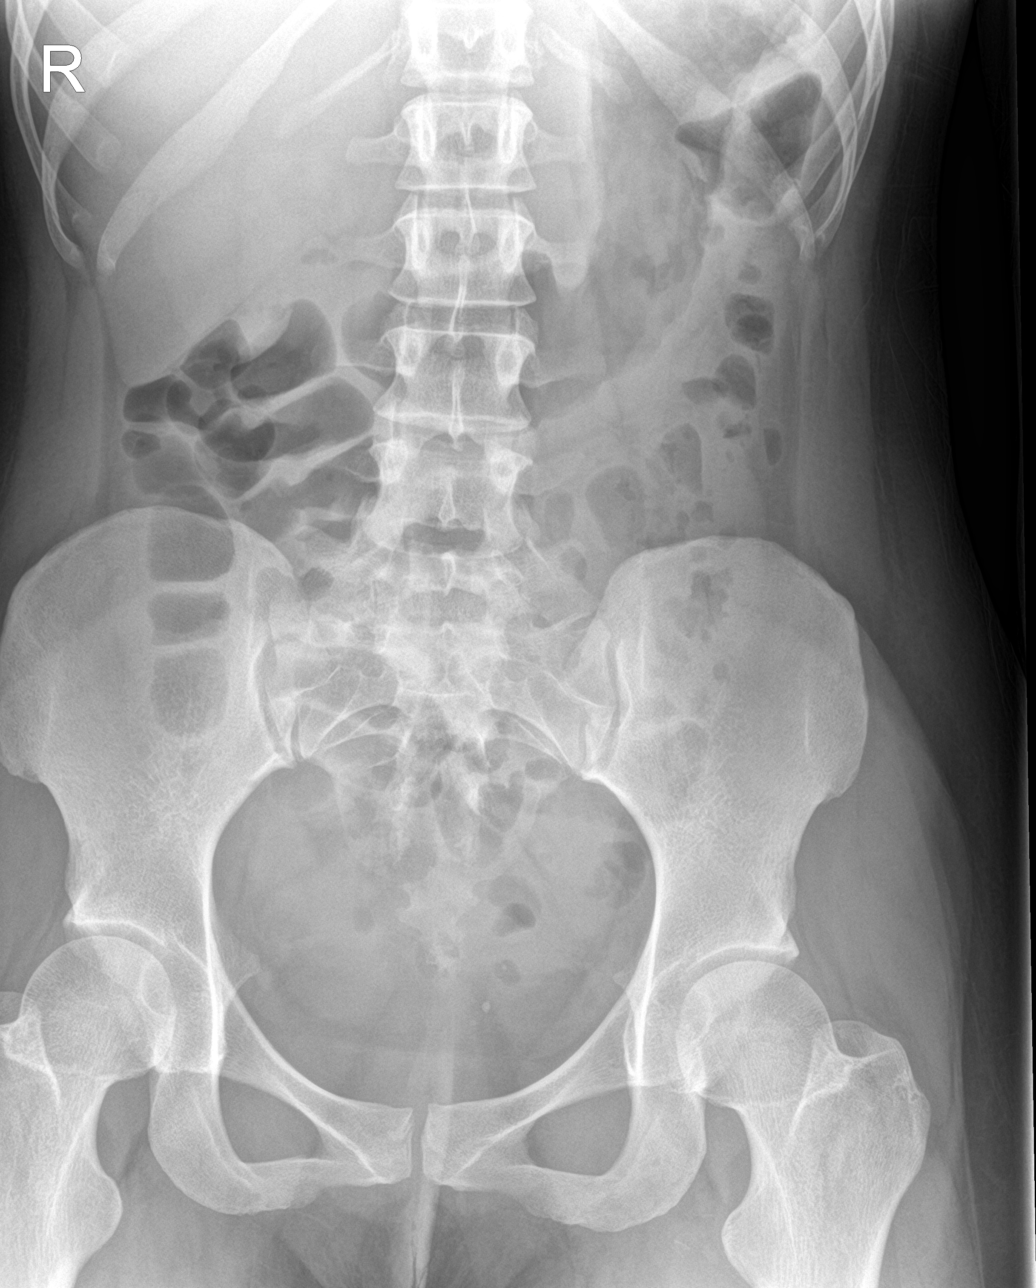

[abdomen erect]
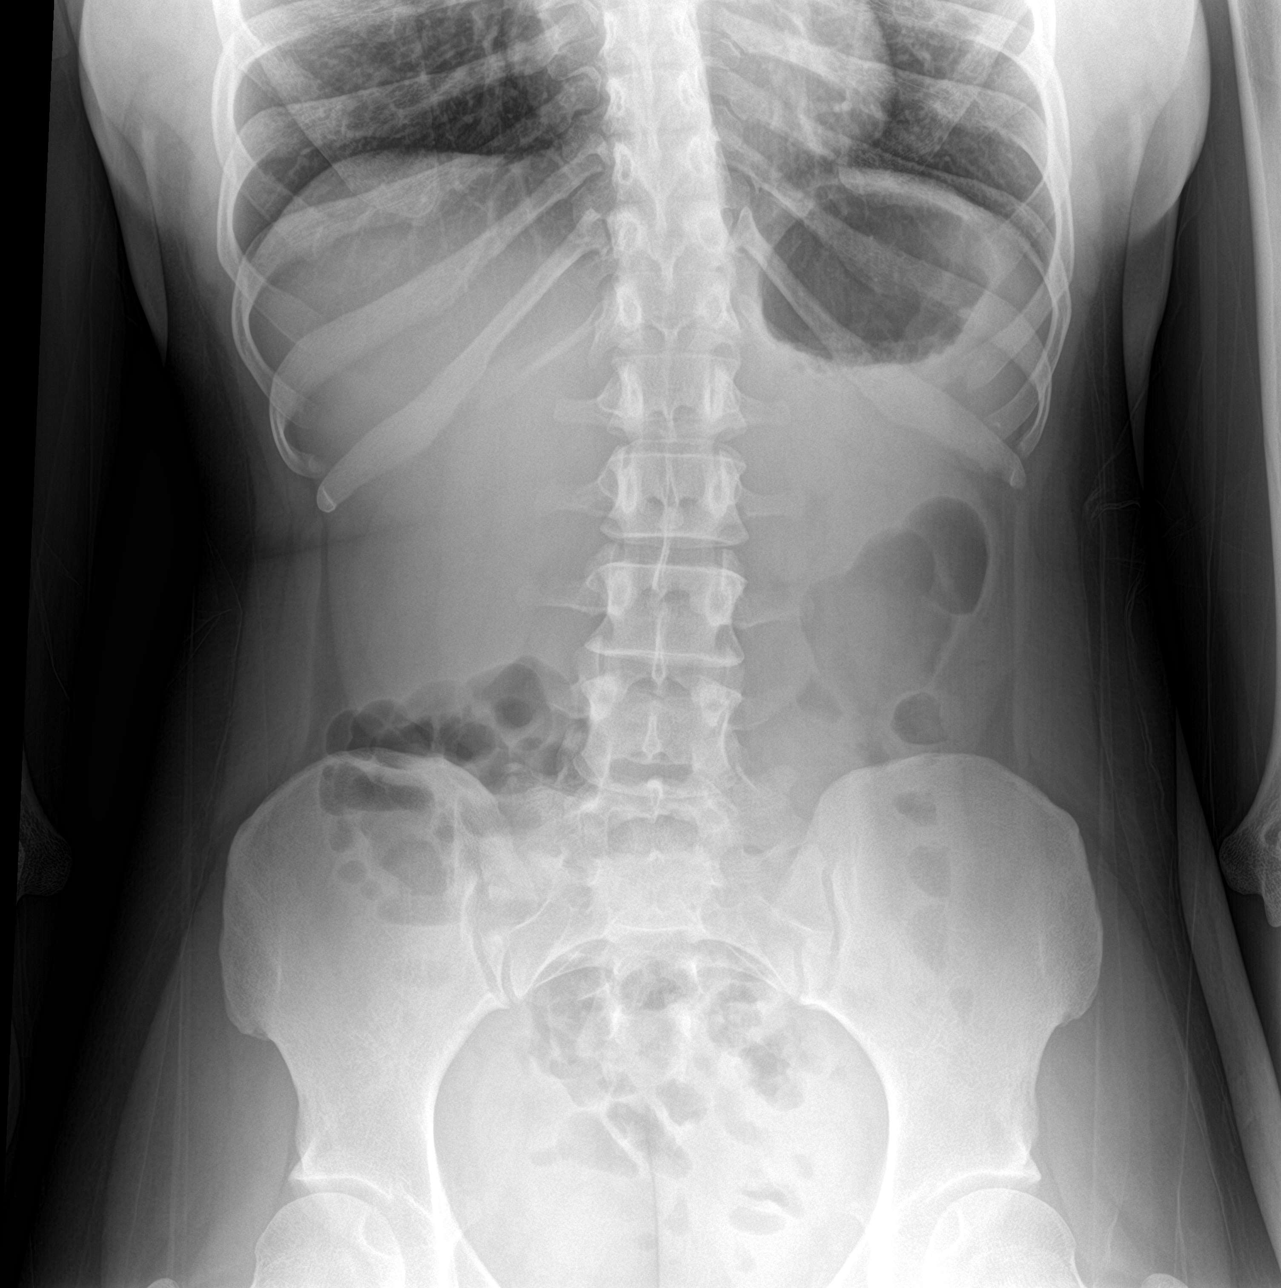

[3 of 3 positions shown; findings below may reference images not displayed]

FINDINGS: PA view of the chest. Lung volumes and mediastinal contours are
normal. Visualized tracheal air column is within normal limits. Both
lungs are clear. No pneumothorax or pneumoperitoneum.

Upright and supine views of the abdomen and pelvis. Non obstructed
bowel gas pattern. No significant retained stool. Abdominal and
pelvic visceral contours are within normal limits; small left
central pelvic phleboliths suspected.

Transitional lumbosacral anatomy. Mild thoracolumbar scoliosis. No
acute osseous abnormality identified.
IMPRESSION: 1. Normal bowel gas pattern.  No retained stool.  No free air.
2. Negative chest.
